# Patient Record
Sex: Male | Born: 1965 | Race: White | Hispanic: No | Marital: Married | Smoking: Never smoker
Health system: Southern US, Community
[De-identification: ages and names within clinical notes are randomized; demographics above are authoritative.]

## PROBLEM LIST (undated history)

## (undated) DIAGNOSIS — E291 Testicular hypofunction: Secondary | ICD-10-CM

## (undated) DIAGNOSIS — C801 Malignant (primary) neoplasm, unspecified: Secondary | ICD-10-CM

## (undated) DIAGNOSIS — Z8547 Personal history of malignant neoplasm of testis: Secondary | ICD-10-CM

## (undated) DIAGNOSIS — D582 Other hemoglobinopathies: Secondary | ICD-10-CM

## (undated) DIAGNOSIS — K3580 Unspecified acute appendicitis: Secondary | ICD-10-CM

## (undated) DIAGNOSIS — N400 Enlarged prostate without lower urinary tract symptoms: Secondary | ICD-10-CM

## (undated) DIAGNOSIS — E663 Overweight: Secondary | ICD-10-CM

## (undated) DIAGNOSIS — M109 Gout, unspecified: Secondary | ICD-10-CM

## (undated) DIAGNOSIS — K3532 Acute appendicitis with perforation and localized peritonitis, without abscess: Secondary | ICD-10-CM

## (undated) DIAGNOSIS — R739 Hyperglycemia, unspecified: Secondary | ICD-10-CM

## (undated) DIAGNOSIS — R7989 Other specified abnormal findings of blood chemistry: Secondary | ICD-10-CM

## (undated) DIAGNOSIS — R740 Nonspecific elevation of levels of transaminase and lactic acid dehydrogenase [LDH]: Secondary | ICD-10-CM

## (undated) DIAGNOSIS — E7800 Pure hypercholesterolemia, unspecified: Secondary | ICD-10-CM

## (undated) HISTORY — DX: Other specified abnormal findings of blood chemistry: R79.89

## (undated) HISTORY — DX: Pure hypercholesterolemia, unspecified: E78.00

## (undated) HISTORY — DX: Personal history of malignant neoplasm of testis: Z85.47

## (undated) HISTORY — DX: Overweight: E66.3

## (undated) HISTORY — DX: Gout, unspecified: M10.9

## (undated) HISTORY — DX: Nonspecific elevation of levels of transaminase and lactic acid dehydrogenase (ldh): R74.0

## (undated) HISTORY — DX: Unspecified acute appendicitis: K35.80

## (undated) HISTORY — PX: CHOLECYSTECTOMY: SHX55

## (undated) HISTORY — DX: Benign prostatic hyperplasia without lower urinary tract symptoms: N40.0

## (undated) HISTORY — DX: Acute appendicitis with perforation and localized peritonitis, without abscess: K35.32

## (undated) HISTORY — PX: GALLBLADDER SURGERY: SHX652

## (undated) HISTORY — DX: Testicular hypofunction: E29.1

## (undated) HISTORY — DX: Other hemoglobinopathies: D58.2

## (undated) HISTORY — DX: Hyperglycemia, unspecified: R73.9

## (undated) HISTORY — PX: OTHER SURGICAL HISTORY: SHX169

---

## 2004-12-15 ENCOUNTER — Ambulatory Visit: Payer: Self-pay | Admitting: Urology

## 2004-12-29 ENCOUNTER — Ambulatory Visit: Payer: Self-pay | Admitting: Radiation Oncology

## 2005-01-04 ENCOUNTER — Ambulatory Visit: Payer: Self-pay | Admitting: Radiation Oncology

## 2005-02-04 ENCOUNTER — Ambulatory Visit: Payer: Self-pay | Admitting: Radiation Oncology

## 2005-05-15 ENCOUNTER — Ambulatory Visit: Payer: Self-pay | Admitting: Oncology

## 2005-06-04 ENCOUNTER — Ambulatory Visit: Payer: Self-pay | Admitting: Oncology

## 2005-09-16 ENCOUNTER — Ambulatory Visit: Payer: Self-pay | Admitting: Family Medicine

## 2005-09-24 ENCOUNTER — Ambulatory Visit: Payer: Self-pay | Admitting: Family Medicine

## 2005-11-16 ENCOUNTER — Ambulatory Visit: Payer: Self-pay | Admitting: Oncology

## 2005-12-05 ENCOUNTER — Ambulatory Visit: Payer: Self-pay | Admitting: Oncology

## 2006-03-18 ENCOUNTER — Ambulatory Visit: Payer: Self-pay | Admitting: Oncology

## 2006-04-06 ENCOUNTER — Ambulatory Visit: Payer: Self-pay | Admitting: Oncology

## 2006-05-24 ENCOUNTER — Ambulatory Visit: Payer: Self-pay | Admitting: Oncology

## 2006-07-16 ENCOUNTER — Ambulatory Visit: Payer: Self-pay | Admitting: Oncology

## 2006-08-05 ENCOUNTER — Ambulatory Visit: Payer: Self-pay | Admitting: Oncology

## 2007-01-05 ENCOUNTER — Ambulatory Visit: Payer: Self-pay | Admitting: Oncology

## 2007-01-18 ENCOUNTER — Ambulatory Visit: Payer: Self-pay | Admitting: Oncology

## 2007-01-21 ENCOUNTER — Ambulatory Visit: Payer: Self-pay | Admitting: Oncology

## 2007-02-05 ENCOUNTER — Ambulatory Visit: Payer: Self-pay | Admitting: Oncology

## 2007-05-08 ENCOUNTER — Ambulatory Visit: Payer: Self-pay | Admitting: Oncology

## 2007-05-24 ENCOUNTER — Ambulatory Visit: Payer: Self-pay | Admitting: Oncology

## 2007-06-05 ENCOUNTER — Ambulatory Visit: Payer: Self-pay | Admitting: Oncology

## 2007-11-05 ENCOUNTER — Ambulatory Visit: Payer: Self-pay | Admitting: Oncology

## 2007-11-24 ENCOUNTER — Ambulatory Visit: Payer: Self-pay | Admitting: Oncology

## 2007-12-06 ENCOUNTER — Ambulatory Visit: Payer: Self-pay | Admitting: Oncology

## 2008-05-07 ENCOUNTER — Ambulatory Visit: Payer: Self-pay | Admitting: Oncology

## 2008-05-17 ENCOUNTER — Ambulatory Visit: Payer: Self-pay | Admitting: Oncology

## 2008-06-04 ENCOUNTER — Ambulatory Visit: Payer: Self-pay | Admitting: Oncology

## 2008-11-14 ENCOUNTER — Ambulatory Visit: Payer: Self-pay | Admitting: Oncology

## 2008-12-05 ENCOUNTER — Ambulatory Visit: Payer: Self-pay | Admitting: Oncology

## 2008-12-07 ENCOUNTER — Ambulatory Visit: Payer: Self-pay | Admitting: Oncology

## 2009-01-04 ENCOUNTER — Ambulatory Visit: Payer: Self-pay | Admitting: Oncology

## 2009-01-14 ENCOUNTER — Ambulatory Visit: Payer: Self-pay | Admitting: Gastroenterology

## 2009-03-06 ENCOUNTER — Ambulatory Visit: Payer: Self-pay | Admitting: Oncology

## 2009-03-21 ENCOUNTER — Ambulatory Visit: Payer: Self-pay | Admitting: Oncology

## 2009-04-06 ENCOUNTER — Ambulatory Visit: Payer: Self-pay | Admitting: Oncology

## 2009-06-04 ENCOUNTER — Ambulatory Visit: Payer: Self-pay | Admitting: Oncology

## 2009-06-07 ENCOUNTER — Ambulatory Visit: Payer: Self-pay | Admitting: Oncology

## 2009-07-05 ENCOUNTER — Ambulatory Visit: Payer: Self-pay | Admitting: Oncology

## 2010-01-04 ENCOUNTER — Ambulatory Visit: Payer: Self-pay | Admitting: Oncology

## 2010-01-16 ENCOUNTER — Ambulatory Visit: Payer: Self-pay | Admitting: Oncology

## 2010-01-18 LAB — BETA HCG QUANT (REF LAB)

## 2010-02-04 ENCOUNTER — Ambulatory Visit: Payer: Self-pay | Admitting: Oncology

## 2011-01-16 ENCOUNTER — Ambulatory Visit: Payer: Self-pay | Admitting: Oncology

## 2011-01-19 ENCOUNTER — Ambulatory Visit: Payer: Self-pay | Admitting: Oncology

## 2011-01-20 LAB — BETA HCG QUANT (REF LAB)

## 2011-01-20 LAB — AFP TUMOR MARKER: AFP-Tumor Marker: 2.5 ng/mL (ref 0.0–8.3)

## 2011-02-05 ENCOUNTER — Ambulatory Visit: Payer: Self-pay | Admitting: Oncology

## 2011-03-04 ENCOUNTER — Emergency Department: Payer: Self-pay | Admitting: Emergency Medicine

## 2012-01-19 ENCOUNTER — Ambulatory Visit: Payer: Self-pay | Admitting: Oncology

## 2012-01-26 ENCOUNTER — Ambulatory Visit: Payer: Self-pay | Admitting: Oncology

## 2012-01-26 LAB — COMPREHENSIVE METABOLIC PANEL
Albumin: 4.4 g/dL (ref 3.4–5.0)
Alkaline Phosphatase: 65 U/L (ref 50–136)
Anion Gap: 11 (ref 7–16)
BUN: 11 mg/dL (ref 7–18)
Bilirubin,Total: 1.1 mg/dL — ABNORMAL HIGH (ref 0.2–1.0)
Calcium, Total: 9.4 mg/dL (ref 8.5–10.1)
Chloride: 100 mmol/L (ref 98–107)
Creatinine: 1.11 mg/dL (ref 0.60–1.30)
Glucose: 93 mg/dL (ref 65–99)
Osmolality: 273 (ref 275–301)
Potassium: 4.1 mmol/L (ref 3.5–5.1)
SGOT(AST): 25 U/L (ref 15–37)
Sodium: 137 mmol/L (ref 136–145)
Total Protein: 7.8 g/dL (ref 6.4–8.2)

## 2012-01-26 LAB — CBC CANCER CENTER
Basophil #: 0 x10 3/mm (ref 0.0–0.1)
Eosinophil #: 0.1 x10 3/mm (ref 0.0–0.7)
Lymphocyte #: 1.5 x10 3/mm (ref 1.0–3.6)
MCH: 31.5 pg (ref 26.0–34.0)
Monocyte %: 9 %
Neutrophil %: 72.1 %
Platelet: 201 x10 3/mm (ref 150–440)
RBC: 5.38 10*6/uL (ref 4.40–5.90)
RDW: 14.2 % (ref 11.5–14.5)
WBC: 8.5 x10 3/mm (ref 3.8–10.6)

## 2012-01-27 LAB — BETA HCG QUANT (REF LAB)

## 2012-02-05 ENCOUNTER — Ambulatory Visit: Payer: Self-pay | Admitting: Oncology

## 2013-01-17 ENCOUNTER — Ambulatory Visit: Payer: Self-pay | Admitting: Oncology

## 2013-01-17 LAB — COMPREHENSIVE METABOLIC PANEL
Anion Gap: 12 (ref 7–16)
Bilirubin,Total: 1 mg/dL (ref 0.2–1.0)
Co2: 26 mmol/L (ref 21–32)
Creatinine: 1.14 mg/dL (ref 0.60–1.30)
EGFR (African American): >60
EGFR (Non-African Amer.): >60
Glucose: 111 mg/dL — ABNORMAL HIGH (ref 65–99)
Total Protein: 7.6 g/dL (ref 6.4–8.2)

## 2013-01-17 LAB — CBC CANCER CENTER
Basophil #: 0.1 x10 3/mm (ref 0.0–0.1)
Basophil %: 0.6 %
Eosinophil #: 0.2 x10 3/mm (ref 0.0–0.7)
Eosinophil %: 1.6 %
HGB: 18 g/dL (ref 13.0–18.0)
Lymphocyte #: 1.7 x10 3/mm (ref 1.0–3.6)
Lymphocyte %: 17.6 %
MCH: 32.5 pg (ref 26.0–34.0)
MCHC: 35 g/dL (ref 32.0–36.0)
MCV: 93 fL (ref 80–100)
Platelet: 230 x10 3/mm (ref 150–440)
RBC: 5.54 10*6/uL (ref 4.40–5.90)
RDW: 14 % (ref 11.5–14.5)

## 2013-01-18 LAB — AFP TUMOR MARKER: AFP-Tumor Marker: 2.3 ng/mL (ref 0.0–8.3)

## 2013-02-03 ENCOUNTER — Ambulatory Visit: Payer: Self-pay

## 2013-02-04 ENCOUNTER — Ambulatory Visit: Payer: Self-pay | Admitting: Oncology

## 2013-02-17 ENCOUNTER — Ambulatory Visit: Payer: Self-pay | Admitting: Vascular Surgery

## 2014-01-24 ENCOUNTER — Ambulatory Visit: Payer: Self-pay | Admitting: Oncology

## 2014-01-25 LAB — AFP TUMOR MARKER: AFP TUMOR MARKER: 2.3 ng/mL (ref 0.0–8.3)

## 2014-05-02 ENCOUNTER — Ambulatory Visit: Payer: Self-pay | Admitting: Oncology

## 2014-05-02 LAB — CBC CANCER CENTER
BASOS ABS: 0 x10 3/mm (ref 0.0–0.1)
Basophil %: 0.5 %
Eosinophil #: 0.2 x10 3/mm (ref 0.0–0.7)
Eosinophil %: 2.4 %
HCT: 50.1 % (ref 40.0–52.0)
HGB: 17.1 g/dL (ref 13.0–18.0)
Lymphocyte #: 1.4 x10 3/mm (ref 1.0–3.6)
Lymphocyte %: 16.3 %
MCH: 30.8 pg (ref 26.0–34.0)
MCHC: 34.1 g/dL (ref 32.0–36.0)
MCV: 90 fL (ref 80–100)
MONO ABS: 0.9 x10 3/mm (ref 0.2–1.0)
Monocyte %: 10.9 %
Neutrophil #: 5.8 x10 3/mm (ref 1.4–6.5)
Neutrophil %: 69.9 %
PLATELETS: 207 x10 3/mm (ref 150–440)
RBC: 5.56 10*6/uL (ref 4.40–5.90)
RDW: 13.1 % (ref 11.5–14.5)
WBC: 8.3 x10 3/mm (ref 3.8–10.6)

## 2014-05-07 ENCOUNTER — Ambulatory Visit: Payer: Self-pay | Admitting: Oncology

## 2014-07-20 ENCOUNTER — Other Ambulatory Visit: Payer: Self-pay | Admitting: Oncology

## 2014-07-20 DIAGNOSIS — Z8547 Personal history of malignant neoplasm of testis: Secondary | ICD-10-CM

## 2014-08-03 ENCOUNTER — Ambulatory Visit
Admit: 2014-08-03 | Disposition: A | Discharge status: Home / Self Care | Payer: Self-pay | Attending: Oncology | Admitting: Oncology

## 2014-08-03 LAB — CANCER CENTER HEMOGLOBIN: HGB: 17.3 g/dL (ref 13.0–18.0)

## 2014-08-03 LAB — CANCER CENTER HEMATOCRIT: HCT: 49.7 % (ref 40.0–52.0)

## 2014-10-12 ENCOUNTER — Other Ambulatory Visit: Payer: Self-pay

## 2014-10-12 DIAGNOSIS — E291 Testicular hypofunction: Secondary | ICD-10-CM

## 2014-10-13 LAB — TESTOSTERONE: Testosterone: 341 ng/dL — ABNORMAL LOW (ref 348–1197)

## 2014-10-13 LAB — PSA: Prostate Specific Ag, Serum: 1.1 ng/mL (ref 0.0–4.0)

## 2014-10-13 LAB — HEMATOCRIT: Hematocrit: 49.7 % (ref 37.5–51.0)

## 2014-10-17 ENCOUNTER — Ambulatory Visit: Payer: Self-pay | Admitting: Urology

## 2014-10-22 ENCOUNTER — Telehealth: Payer: Self-pay

## 2014-10-22 NOTE — Telephone Encounter (Signed)
Spoke with pt in reference to labs. Labs were drawn at 8:41 a.m. Pt was transferred to the front to make appt.

## 2014-10-22 NOTE — Telephone Encounter (Signed)
-----   Message from Nori Riis, PA-C sent at 10/21/2014  9:32 AM EDT ----- Patient needs an appointment.  Is the testosterone drawn in the am?

## 2014-10-23 ENCOUNTER — Other Ambulatory Visit: Payer: Self-pay

## 2014-10-26 ENCOUNTER — Other Ambulatory Visit: Payer: Self-pay | Admitting: *Deleted

## 2014-10-26 DIAGNOSIS — D751 Secondary polycythemia: Secondary | ICD-10-CM

## 2014-10-30 ENCOUNTER — Inpatient Hospital Stay: Payer: Self-pay

## 2014-10-30 ENCOUNTER — Inpatient Hospital Stay: Payer: Self-pay | Admitting: Oncology

## 2014-11-01 ENCOUNTER — Inpatient Hospital Stay: Payer: 59 | Attending: Oncology

## 2014-11-01 ENCOUNTER — Inpatient Hospital Stay (HOSPITAL_BASED_OUTPATIENT_CLINIC_OR_DEPARTMENT_OTHER): Payer: 59 | Admitting: Oncology

## 2014-11-01 ENCOUNTER — Inpatient Hospital Stay: Payer: 59

## 2014-11-01 ENCOUNTER — Encounter: Payer: Self-pay | Admitting: Oncology

## 2014-11-01 VITALS — BP 129/82 | HR 60 | Temp 95.9°F | Wt 231.3 lb

## 2014-11-01 DIAGNOSIS — Z79899 Other long term (current) drug therapy: Secondary | ICD-10-CM | POA: Insufficient documentation

## 2014-11-01 DIAGNOSIS — D751 Secondary polycythemia: Secondary | ICD-10-CM | POA: Insufficient documentation

## 2014-11-01 DIAGNOSIS — Z87891 Personal history of nicotine dependence: Secondary | ICD-10-CM

## 2014-11-01 DIAGNOSIS — C6290 Malignant neoplasm of unspecified testis, unspecified whether descended or undescended: Secondary | ICD-10-CM

## 2014-11-01 DIAGNOSIS — Z8547 Personal history of malignant neoplasm of testis: Secondary | ICD-10-CM | POA: Diagnosis not present

## 2014-11-01 LAB — CBC WITH DIFFERENTIAL/PLATELET
BASOS ABS: 0.1 10*3/uL (ref 0–0.1)
Basophils Relative: 1 %
EOS PCT: 2 %
Eosinophils Absolute: 0.1 10*3/uL (ref 0–0.7)
HCT: 49.1 % (ref 40.0–52.0)
Hemoglobin: 16.7 g/dL (ref 13.0–18.0)
Lymphocytes Relative: 24 %
Lymphs Abs: 1.7 10*3/uL (ref 1.0–3.6)
MCH: 30.8 pg (ref 26.0–34.0)
MCHC: 33.9 g/dL (ref 32.0–36.0)
MCV: 90.7 fL (ref 80.0–100.0)
Monocytes Absolute: 0.8 10*3/uL (ref 0.2–1.0)
Monocytes Relative: 11 %
NEUTROS ABS: 4.6 10*3/uL (ref 1.4–6.5)
NEUTROS PCT: 62 %
PLATELETS: 156 10*3/uL (ref 150–440)
RBC: 5.41 MIL/uL (ref 4.40–5.90)
RDW: 13.6 % (ref 11.5–14.5)
WBC: 7.3 10*3/uL (ref 3.8–10.6)

## 2014-11-01 NOTE — Progress Notes (Signed)
Patient does not have living will.  Declined information.  Former smoker. 

## 2014-11-04 NOTE — Progress Notes (Signed)
[place] @ [facility] Telephone:([phone]  Fax:(336) [place] OB: 15-Feb-1966  MR#: [id number]  HKV#:[id number]  Patient Care Team: Morton Peters., MD as PCP - General (Family Medicine)  CHIEF COMPLAINT:  Chief Complaint  Patient presents with  . Follow-up   Chief Complaint/Diagnosis:   Seminoma status post radicalOrchiectomy and prosthetic testes placementStatus post one cycle of carboplatinum Diagnoses in the September of 3259(49 years old) 2.  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  Shortness of breath no cough no headache     REVIEW OF SYSTEMS:   GENERAL:  Feels good.  Active.  No fevers, sweats or weight loss. PERFORMANCE STATUS (ECOG):  0 HEENT:  No visual changes, runny nose, sore throat, mouth sores or tenderness. Lungs: No shortness of breath or cough.  No hemoptysis. Cardiac:  No chest pain, palpitations, orthopnea, or PND. GI:  No nausea, vomiting, diarrhea, constipation, melena or hematochezia. GU:  No urgency, frequency, dysuria, or hematuria. Musculoskeletal:  No back pain.  No joint pain.  No muscle tenderness. Extremities:  No pain or swelling. Skin:  No rashes or skin changes. Neuro:  No headache, numbness or weakness, balance or coordination issues. Endocrine:  No diabetes, thyroid issues, hot flashes or night sweats. Psych:  No mood changes, depression or anxiety. Pain:  No focal pain. Review of systems:  All other systems reviewed and found to be negative. As per HPI. Otherwise, a complete review of systems is negatve.   Significant History/PMH:   Migraines:    orchiectomy:    Gall Bladder:   PFSH: Family History: noncontributory  Social History: noncontributory  Additional Past Medical and Surgical History:  Does not smoke does not drink   As mentioned above   ADVANCED DIRECTIVES:  Patient does not have any living will or healthcare power of attorney.  Information was given .  Available resources had been discussed.  We will follow-up on subsequent appointments regarding this issue  HEALTH MAINTENANCE: History  Substance Use Topics  . Smoking status: Former Research scientist (life sciences)  . Smokeless tobacco: Not on file  . Alcohol Use: Not on file      No Known Allergies  Current Outpatient Prescriptions  Medication Sig Dispense Refill  . allopurinol (ZYLOPRIM) 300 MG tablet TAKE 1 TABLET EVERY DAY AS DIRECTED  6   No current facility-administered medications for this visit.    OBJECTIVE:  Filed Vitals:   11/01/14 0857  BP: 129/82  Pulse: 60  Temp: 95.9 F (35.5 C)     There is no height on file to calculate BMI.    ECOG FS:0 - Asymptomatic  PHYSICAL EXAM: GENERAL:  Well developed, well nourished, sitting comfortably in the exam room in no acute distress. MENTAL STATUS:  Alert and oriented to person, place and time. . ENT:  Oropharynx clear without lesion.  Tongue normal. Mucous membranes moist.  RESPIRATORY:  Clear to auscultation without rales, wheezes or rhonchi. CARDIOVASCULAR:  Regular rate and rhythm without murmur, rub or gallop.  ABDOMEN:  Soft, non-tender, with active bowel sounds, and no hepatosplenomegaly.  No masses. BACK:  No CVA tenderness.  No tenderness on percussion of the back or rib cage. SKIN:  No rashes, ulcers or lesions. EXTREMITIES: No edema, no skin discoloration or tenderness.  No palpable cords. LYMPH NODES: No palpable cervical, supraclavicular, axillary  or inguinal adenopathy  NEUROLOGICAL: Unremarkable. PSYCH:  Appropriate.   LAB RESULTS:  Appointment on 11/01/2014  Component Date Value Ref Range Status  . WBC 11/01/2014 7.3  3.8 - 10.6 K/uL Final  . RBC 11/01/2014 5.41  4.40 - 5.90 MIL/uL Final  . Hemoglobin 11/01/2014 16.7  13.0 - 18.0 g/dL Final  . HCT  11/01/2014 49.1  40.0 - 52.0 % Final  . MCV 11/01/2014 90.7  80.0 - 100.0 fL Final  . MCH 11/01/2014 30.8  26.0 - 34.0 pg Final  . MCHC 11/01/2014 33.9  32.0 - 36.0 g/dL Final  . RDW 11/01/2014 13.6  11.5 - 14.5 % Final  . Platelets 11/01/2014 156  150 - 440 K/uL Final  . Neutrophils Relative % 11/01/2014 62   Final  . Neutro Abs 11/01/2014 4.6  1.4 - 6.5 K/uL Final  . Lymphocytes Relative 11/01/2014 24   Final  . Lymphs Abs 11/01/2014 1.7  1.0 - 3.6 K/uL Final  . Monocytes Relative 11/01/2014 11   Final  . Monocytes Absolute 11/01/2014 0.8  0.2 - 1.0 K/uL Final  . Eosinophils Relative 11/01/2014 2   Final  . Eosinophils Absolute 11/01/2014 0.1  0 - 0.7 K/uL Final  . Basophils Relative 11/01/2014 1   Final  . Basophils Absolute 11/01/2014 0.1  0 - 0.1 K/uL Final     ASSESSMENT: 1.  Secondary polycythemia.  Patient is asymptomatic.  We will hold off phlebotomy. Patient was advised to see if they can reduce the dose of testosterone. 2.  Seminoma No evidence of recurrent disease  MEDICAL DECISION MAKING:  All lab data has been reviewed  Patient expressed understanding and was in agreement with this plan. He also understands that He can call clinic at any time with any questions, concerns, or complaints.    No matching staging information was found for the patient.  Forest Gleason, MD   11/04/2014 4:00 PM

## 2014-11-05 ENCOUNTER — Encounter: Payer: Self-pay | Admitting: *Deleted

## 2014-11-06 ENCOUNTER — Encounter: Payer: Self-pay | Admitting: Urology

## 2014-11-06 ENCOUNTER — Ambulatory Visit (INDEPENDENT_AMBULATORY_CARE_PROVIDER_SITE_OTHER): Payer: 59 | Admitting: Urology

## 2014-11-06 VITALS — BP 114/77 | HR 60 | Ht 69.0 in | Wt 228.4 lb

## 2014-11-06 DIAGNOSIS — N400 Enlarged prostate without lower urinary tract symptoms: Secondary | ICD-10-CM | POA: Insufficient documentation

## 2014-11-06 DIAGNOSIS — E291 Testicular hypofunction: Secondary | ICD-10-CM | POA: Insufficient documentation

## 2014-11-06 DIAGNOSIS — N401 Enlarged prostate with lower urinary tract symptoms: Secondary | ICD-10-CM | POA: Diagnosis not present

## 2014-11-06 DIAGNOSIS — Z8547 Personal history of malignant neoplasm of testis: Secondary | ICD-10-CM | POA: Insufficient documentation

## 2014-11-06 DIAGNOSIS — N138 Other obstructive and reflux uropathy: Secondary | ICD-10-CM

## 2014-11-06 LAB — BLADDER SCAN AMB NON-IMAGING: Scan Result: 24

## 2014-11-06 NOTE — Progress Notes (Signed)
11/06/2014 9:32 AM   Tyrone Madden 04/22/1965 [id number]  Referring provider: No referring provider defined for this encounter.  Chief Complaint  Patient presents with  . Follow-up    Go over tests results  ( Lab results)    HPI: Tyrone Madden is a 49 year old white male with hypogonadism and BPH with LUTS who presents today for a follow-up visit.  His hypogonadism is currently being managed with Testopel insertions.  His last insertion was in February 2016. Today, he complains of lack of energy, a decrease in strength in his erections being less strong and a recent deterioration in his ability to play sports.  He was recently found to have have polycythemia.  It was recommended that his testosterone dose been reduced by his oncologist.       Androgen Deficiency in the Aging Male      11/06/14 0900       Androgen Deficiency in the Aging Male   Do you have a decrease in libido (sex drive) No     Do you have lack of energy Yes     Do you have a decrease in strength and/or endurance Yes     Have you lost height No     Have you noticed a decreased "enjoyment of life" No     Are you sad and/or grumpy No     Are your erections less strong Yes     Have you noticed a recent deterioration in your ability to play sports Yes     Are you falling asleep after dinner No     Has there been a recent deterioration in your work performance No       His IPSS score today is 5, which is mild lower urinary tract symptomatology. He is pleased with his quality life due to his urinary symptoms. His PVR is  24 mL.   He has no urinary complaints at this time.  He denies any dysuria, hematuria or suprapubic pain.   Previous PSA's:      1.0 ng/mL on 10/24/2012      0.9 ng/mL on 05/18/2013      1.2 ng/mL on 11/01/2013      1.1 ng/mL on 04/24/2014   He also denies any recent fevers, chills, nausea or vomiting.  He does not have a family history of PCa.      IPSS      11/06/14 0900       International Prostate Symptom Score   How often have you had the sensation of not emptying your bladder? Less than 1 in 5     How often have you had to urinate less than every two hours? Not at All     How often have you found you stopped and started again several times when you urinated? About half the time     How often have you found it difficult to postpone urination? Not at All     How often have you had a weak urinary stream? Not at All     How often have you had to strain to start urination? Not at All     How many times did you typically get up at night to urinate? 1 Time     Total IPSS Score 5     Quality of Life due to urinary symptoms   If you were to spend the rest of your life with your urinary condition just the way it is now how would  you feel about that? Pleased        Score:  1-7 Mild 8-19 Moderate 20-35 Severe     PMH: Past Medical History  Diagnosis Date  . Gout   . Hypogonadism in male   . Over weight   . BPH (benign prostatic hyperplasia)   . History of testicular cancer   . Elevated hemoglobin     Surgical History: Past Surgical History  Procedure Laterality Date  . Orchiectomy unilateral    . Gallbladder surgery      Home Medications:    Medication List       This list is accurate as of: 11/06/14  9:32 AM.  Always use your most recent med list.               allopurinol 300 MG tablet  Commonly known as:  ZYLOPRIM  TAKE 1 TABLET EVERY DAY AS DIRECTED        Allergies: No Known Allergies  Family History: Family History  Problem Relation Age of Onset  . Heart disease    . Prostate cancer Neg Hx   . Bladder Cancer Neg Hx     Social History:  reports that he has never smoked. He does not have any smokeless tobacco history on file. He reports that he drinks alcohol. He reports that he does not use illicit drugs.  ROS: UROLOGY Frequent Urination?: No Hard to postpone urination?: No Burning/pain with urination?: No Get up  at night to urinate?: No Leakage of urine?: No Urine stream starts and stops?: No Trouble starting stream?: No Do you have to strain to urinate?: No Blood in urine?: No Urinary tract infection?: No Sexually transmitted disease?: No Injury to kidneys or bladder?: No Painful intercourse?: No Weak stream?: No Erection problems?: No Penile pain?: No  Gastrointestinal Nausea?: No Vomiting?: No Indigestion/heartburn?: No Diarrhea?: No Constipation?: No  Constitutional Fever: No Night sweats?: No Weight loss?: No Fatigue?: No  Skin Skin rash/lesions?: No Itching?: No  Eyes Blurred vision?: No Double vision?: No  Ears/Nose/Throat Sore throat?: No Sinus problems?: No  Hematologic/Lymphatic Swollen glands?: No Easy bruising?: No  Cardiovascular Leg swelling?: No Chest pain?: No  Respiratory Cough?: No Shortness of breath?: No  Endocrine Excessive thirst?: No  Musculoskeletal Back pain?: No Joint pain?: No  Neurological Headaches?: No Dizziness?: No  Psychologic Depression?: No Anxiety?: No  Physical Exam: BP 114/77 mmHg  Pulse 60  Ht 5\' 9"  (1.753 m)  Wt 228 lb 6.4 oz (103.602 kg)  BMI 33.71 kg/m2  GU: Patient with circumcision phallus.  Urethral meatus is patent.  No penile discharge. No penile lesions or rashes. Scrotum without lesions, cysts, rashes and/or edema.  Right testicle is located scrotally.  Left testicular prothesis in place. No masses are appreciated in the testicles. Left and right epididymis are normal. Rectal: Patient with  normal sphincter tone. Perineum without scarring or rashes. No rectal masses are appreciated. Prostate is approximately 45 grams, no nodules are appreciated. Seminal vesicles are normal.  Laboratory Data: Results for orders placed or performed in visit on 11/06/14  BLADDER SCAN AMB NON-IMAGING  Result Value Ref Range   Scan Result 24    Lab Results  Component Value Date   WBC 7.3 11/01/2014   HGB 16.7  11/01/2014   HCT 49.1 11/01/2014   MCV 90.7 11/01/2014   PLT 156 11/01/2014    Lab Results  Component Value Date   CREATININE 1.14 01/17/2013    Lab Results  Component Value Date  PSA 1.1 10/12/2014    Lab Results  Component Value Date   TESTOSTERONE 341* 10/12/2014    No results found for: HGBA1C  Urinalysis No results found for: COLORURINE, APPEARANCEUR, LABSPEC, PHURINE, GLUCOSEU, HGBUR, BILIRUBINUR, KETONESUR, PROTEINUR, UROBILINOGEN, NITRITE, LEUKOCYTESUR  Pertinent Imaging:   Assessment & Plan:    1. Hypogonadism in male:   Patient's hypogonadism is managed with Testopel insetion.  He was found to be have polycythemia and it is recommended that he has his testosterone dose be reduced by his oncologist.    Recent testosterone is 341 ng/dL on 10/12/2014.   Patient will be scheduled for Testopel insertion with 6 pellets in September.   - Testosterone  2. BPH (benign prostatic hyperplasia) with LUTS:  Patient's IPSS score is 5/1.  His PVR 24 mL.  His DRE demonstrates slight enlargement.  We will continue observation.  He will follow up in 6 months for a PSA, DRE, PVR and an IPSS.    - PSA - BLADDER SCAN AMB NON-IMAGING  3. 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  Being follow by Dr. Jeb Levering.    No Follow-up on file.  Zara Council, [facility] [address], [place], [place] [address] [phone]

## 2014-11-07 LAB — TESTOSTERONE: TESTOSTERONE: 177 ng/dL — AB (ref 348–1197)

## 2014-11-07 LAB — PSA: Prostate Specific Ag, Serum: 0.8 ng/mL (ref 0.0–4.0)

## 2014-11-29 ENCOUNTER — Encounter: Payer: Self-pay | Admitting: Urology

## 2014-11-29 ENCOUNTER — Ambulatory Visit (INDEPENDENT_AMBULATORY_CARE_PROVIDER_SITE_OTHER): Payer: 59 | Admitting: Urology

## 2014-11-29 VITALS — BP 138/80 | HR 70 | Ht 68.0 in | Wt 233.8 lb

## 2014-11-29 DIAGNOSIS — E291 Testicular hypofunction: Secondary | ICD-10-CM | POA: Diagnosis not present

## 2014-11-29 MED ORDER — TESTOSTERONE 75 MG IL PLLT
75.0000 mg | PELLET | Freq: Once | Status: AC
  Administered 2014-11-29: 75 mg

## 2014-11-29 MED ORDER — LIDOCAINE-EPINEPHRINE 1 %-1:100000 IJ SOLN
10.0000 mL | Freq: Once | INTRAMUSCULAR | Status: AC
  Administered 2014-11-29: 10 mL via INTRADERMAL

## 2014-11-29 NOTE — Progress Notes (Signed)
This is a 49 -year-old male with hypogonadism and he is managed with Testopel. He presents today for Testopel insertion.  Patient is placed on the exam table in the left lateral jackknife position.  Identified upper outer quadrant of hip for insertion; prepped area with Betadine and injected 10 cc's of Lidocaine 1% with Epinephrine to anesthetize superficially and distally along trocar tract.  Made 3 mm incision using 15 blade of scalpel; trocar with sharp ended stylet was inserted into subcutaneous tissue in line with femur. Sharp stylet was withdrawn and 6 pellets were placed into trocar well. Testopel pellets advanced into tissue using blunt ended stylet. Trocar removed and incision closed using 6 Steri-Strips. Cleansed area to remove Betadine and covered Steri-Strips with outer Band-Aid.  Careful inspection of insertion is done and patient informed of post procedure instructions.  He will return in three month for serum testosterone before 9:00am.

## 2015-01-28 ENCOUNTER — Other Ambulatory Visit: Payer: Self-pay | Admitting: *Deleted

## 2015-01-28 ENCOUNTER — Inpatient Hospital Stay: Payer: 59 | Attending: Oncology

## 2015-01-28 DIAGNOSIS — Z8547 Personal history of malignant neoplasm of testis: Secondary | ICD-10-CM | POA: Diagnosis present

## 2015-01-28 DIAGNOSIS — C6290 Malignant neoplasm of unspecified testis, unspecified whether descended or undescended: Secondary | ICD-10-CM

## 2015-01-28 LAB — CBC WITH DIFFERENTIAL/PLATELET
Basophils Absolute: 0 10*3/uL (ref 0–0.1)
Basophils Relative: 1 %
EOS PCT: 2 %
Eosinophils Absolute: 0.1 10*3/uL (ref 0–0.7)
HCT: 49.3 % (ref 40.0–52.0)
Hemoglobin: 17 g/dL (ref 13.0–18.0)
LYMPHS PCT: 24 %
Lymphs Abs: 2.1 10*3/uL (ref 1.0–3.6)
MCH: 31.7 pg (ref 26.0–34.0)
MCHC: 34.5 g/dL (ref 32.0–36.0)
MCV: 91.9 fL (ref 80.0–100.0)
MONO ABS: 0.8 10*3/uL (ref 0.2–1.0)
MONOS PCT: 9 %
Neutro Abs: 5.6 10*3/uL (ref 1.4–6.5)
Neutrophils Relative %: 64 %
Platelets: 181 10*3/uL (ref 150–440)
RBC: 5.36 MIL/uL (ref 4.40–5.90)
RDW: 13.3 % (ref 11.5–14.5)
WBC: 8.7 10*3/uL (ref 3.8–10.6)

## 2015-01-29 LAB — BETA HCG QUANT (REF LAB): Beta hCG, Tumor Marker: <1 m[IU]/mL (ref 0–3)

## 2015-01-29 LAB — AFP TUMOR MARKER: AFP-Tumor Marker: 2.5 ng/mL (ref 0.0–8.3)

## 2015-02-01 ENCOUNTER — Other Ambulatory Visit: Payer: Self-pay | Admitting: Oncology

## 2015-02-01 DIAGNOSIS — Z8547 Personal history of malignant neoplasm of testis: Secondary | ICD-10-CM

## 2015-02-06 ENCOUNTER — Ambulatory Visit
Admission: RE | Admit: 2015-02-06 | Discharge: 2015-02-06 | Disposition: A | Discharge status: Home / Self Care | Payer: 59 | Source: Ambulatory Visit | Attending: Oncology | Admitting: Oncology

## 2015-02-06 DIAGNOSIS — Z8547 Personal history of malignant neoplasm of testis: Secondary | ICD-10-CM | POA: Diagnosis not present

## 2015-02-07 ENCOUNTER — Ambulatory Visit: Payer: 59

## 2015-02-07 ENCOUNTER — Ambulatory Visit: Payer: Self-pay | Admitting: Oncology

## 2015-02-07 ENCOUNTER — Other Ambulatory Visit: Payer: 59

## 2015-02-08 ENCOUNTER — Other Ambulatory Visit: Payer: Self-pay | Admitting: *Deleted

## 2015-02-08 DIAGNOSIS — Z8547 Personal history of malignant neoplasm of testis: Secondary | ICD-10-CM

## 2015-02-12 ENCOUNTER — Inpatient Hospital Stay: Payer: 59

## 2015-02-12 ENCOUNTER — Inpatient Hospital Stay: Payer: 59 | Attending: Oncology | Admitting: Oncology

## 2015-02-12 ENCOUNTER — Encounter: Payer: Self-pay | Admitting: Oncology

## 2015-02-12 VITALS — BP 119/87 | HR 69 | Temp 96.5°F | Wt 239.9 lb

## 2015-02-12 DIAGNOSIS — E895 Postprocedural testicular hypofunction: Secondary | ICD-10-CM | POA: Diagnosis not present

## 2015-02-12 DIAGNOSIS — Z8547 Personal history of malignant neoplasm of testis: Secondary | ICD-10-CM

## 2015-02-12 DIAGNOSIS — Z79899 Other long term (current) drug therapy: Secondary | ICD-10-CM | POA: Diagnosis not present

## 2015-02-12 DIAGNOSIS — N400 Enlarged prostate without lower urinary tract symptoms: Secondary | ICD-10-CM | POA: Diagnosis not present

## 2015-02-12 DIAGNOSIS — Z9221 Personal history of antineoplastic chemotherapy: Secondary | ICD-10-CM

## 2015-02-12 DIAGNOSIS — D751 Secondary polycythemia: Secondary | ICD-10-CM | POA: Diagnosis not present

## 2015-02-12 DIAGNOSIS — Z9079 Acquired absence of other genital organ(s): Secondary | ICD-10-CM

## 2015-02-12 DIAGNOSIS — M109 Gout, unspecified: Secondary | ICD-10-CM | POA: Insufficient documentation

## 2015-02-12 NOTE — Progress Notes (Signed)
Tyrone Madden  [phone]  Fax:(336) [phone]     [name] DOB: 22-Jul-1965  MR#: [id number]  MVE#:[id number]  Patient was evaluated on 02/12/2015.  Patient Care Team: Morton Peters., MD as PCP - General (Family Medicine)  CHIEF COMPLAINT:  Chief Complaint  Patient presents with  . OTHER   Patient with history of seminoma (2006), status post radical orchiectomy. Patient also with polycythemia secondary to testosterone use.  INTERVAL HISTORY: Patient is here for continued follow-up and treatment consideration regarding a history of a seminoma. Patient currently also followed up for polycythemia secondary to testosterone use. Patient is status post radical orchiectomy of the left testes with prosthetic testes placement. According to notes he also received one cycle of carboplatinum in 2006. Patient reports overall feeling very well. He reports that recently his urologist has decreased his testosterone dose. He is currently receiving Testopel, 6 pellets injected into the buttocks every 3 months. Patient also reports routine self-examinations.   REVIEW OF SYSTEMS:   Review of Systems  Constitutional: Negative for fever, chills, weight loss, malaise/fatigue and diaphoresis.  HENT: Negative for congestion, ear discharge, ear pain, hearing loss, nosebleeds, sore throat and tinnitus.   Eyes: Negative for blurred vision, double vision, photophobia, pain, discharge and redness.  Respiratory: Negative for cough, hemoptysis, sputum production, shortness of breath, wheezing and stridor.   Cardiovascular: Negative for chest pain, palpitations, orthopnea, claudication, leg swelling and PND.  Gastrointestinal: Negative for heartburn, nausea, vomiting, abdominal pain, diarrhea, constipation, blood in stool and melena.  Genitourinary: Negative.   Musculoskeletal: Negative.   Skin: Negative.   Neurological: Negative for dizziness, tingling, focal weakness,  seizures, weakness and headaches.  Endo/Heme/Allergies: Does not bruise/bleed easily.  Psychiatric/Behavioral: Negative for depression. The patient is not nervous/anxious and does not have insomnia.     As per HPI. Otherwise, a complete review of systems is negatve.   PAST MEDICAL HISTORY: Past Medical History  Diagnosis Date  . Gout   . Hypogonadism in male   . Over weight   . BPH (benign prostatic hyperplasia)   . History of testicular cancer   . Elevated hemoglobin     PAST SURGICAL HISTORY: Past Surgical History  Procedure Laterality Date  . Orchiectomy unilateral    . Gallbladder surgery      FAMILY HISTORY Family History  Problem Relation Age of Onset  . Heart disease    . Prostate cancer Neg Hx   . Bladder Cancer Neg Hx   . Kidney disease Neg Hx     GYNECOLOGIC HISTORY:  No LMP for male patient.     ADVANCED DIRECTIVES:    HEALTH MAINTENANCE: Social History  Substance Use Topics  . Smoking status: Never Smoker   . Smokeless tobacco: Not on file  . Alcohol Use: 0.0 oz/week    0 Standard drinks or equivalent per week     Comment: occasional     Colonoscopy:  PAP:  Bone density:  Lipid panel:  No Known Allergies  Current Outpatient Prescriptions  Medication Sig Dispense Refill  . allopurinol (ZYLOPRIM) 300 MG tablet TAKE 1 TABLET EVERY DAY AS DIRECTED  6   No current facility-administered medications for this visit.    OBJECTIVE: BP 119/87 mmHg  Pulse 69  Temp(Src) 96.5 F (35.8 C) (Tympanic)  Wt 239 lb 13.8 oz (108.8 kg)   Body mass index is 36.48 kg/(m^2).    ECOG FS:0 - Asymptomatic  General: Well-developed, well-nourished, no acute distress. Eyes: Pink conjunctiva, anicteric  sclera. HEENT: Normocephalic, moist mucous membranes, clear oropharnyx. Lungs: Clear to auscultation bilaterally. Heart: Regular rate and rhythm. No rubs, murmurs, or gallops. Abdomen: Soft, nontender, nondistended. No organomegaly noted, normoactive bowel  sounds. Musculoskeletal: No edema, cyanosis, or clubbing. Neuro: Alert, answering all questions appropriately. Cranial nerves grossly intact. Skin: No rashes or petechiae noted. Psych: Normal affect. Lymphatics: No cervical or clavicular LAD.   LAB RESULTS:  No visits with results within 3 Day(s) from this visit. Latest known visit with results is:  Appointment on 01/28/2015  Component Date Value Ref Range Status  . WBC 01/28/2015 8.7  3.8 - 10.6 K/uL Final  . RBC 01/28/2015 5.36  4.40 - 5.90 MIL/uL Final  . Hemoglobin 01/28/2015 17.0  13.0 - 18.0 g/dL Final  . HCT 01/28/2015 49.3  40.0 - 52.0 % Final  . MCV 01/28/2015 91.9  80.0 - 100.0 fL Final  . MCH 01/28/2015 31.7  26.0 - 34.0 pg Final  . MCHC 01/28/2015 34.5  32.0 - 36.0 g/dL Final  . RDW 01/28/2015 13.3  11.5 - 14.5 % Final  . Platelets 01/28/2015 181  150 - 440 K/uL Final  . Neutrophils Relative % 01/28/2015 64   Final  . Neutro Abs 01/28/2015 5.6  1.4 - 6.5 K/uL Final  . Lymphocytes Relative 01/28/2015 24   Final  . Lymphs Abs 01/28/2015 2.1  1.0 - 3.6 K/uL Final  . Monocytes Relative 01/28/2015 9   Final  . Monocytes Absolute 01/28/2015 0.8  0.2 - 1.0 K/uL Final  . Eosinophils Relative 01/28/2015 2   Final  . Eosinophils Absolute 01/28/2015 0.1  0 - 0.7 K/uL Final  . Basophils Relative 01/28/2015 1   Final  . Basophils Absolute 01/28/2015 0.0  0 - 0.1 K/uL Final  . AFP-Tumor Marker 01/28/2015 2.5  0.0 - 8.3 ng/mL Final   Comment: (NOTE) Roche ECLIA methodology Performed At: [facility] [place], [place] [id number] Lindon Romp MD OE:[id number]   . Beta hCG, Tumor Marker 01/28/2015 <1  0 - 3 mIU/mL Final   Comment: (NOTE) Roche ECLIA methodology Performed At: [facility] [place], [place] [id number] Lindon Romp MD VQ:[id number]     STUDIES: No results found.  ASSESSMENT:  Seminoma. Secondary polycythemia.   PLAN:  1. Seminoma. Patient is status  post left colectomy with testicular prosthesis placed. Patient has had an ultrasound on 02/06/2015 that revealed no evidence of right testicular mass, torsion, or acute epididymitis. Also no abnormal findings in the left orchiectomy bed. Clinically there is no evidence of recurrent disease. AFP and beta hCG tumor markers are within normal limits.   2. Secondary polycythemia. 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 Patient is currently asymptomatic. Patient is currently receiving Testopel 6 pellets inserted in the buttocks every 3 months urologists office. This has decreased from 15 tablets every 6 months. Hematocrit remains stable, does not require phlebotomy at this time. We will continue to monitor.  Patient expressed understanding and was in agreement with this plan. He also understands that He can call clinic at any time with any questions, concerns, or complaints.   Dr. Oliva Bustard was available for consultation and review of plan of care for this patient.  Evlyn Kanner, NP   02/12/2015 11:46 AM

## 2015-02-12 NOTE — Progress Notes (Deleted)
[place] @ [facility] Telephone:(336) [phone]  Fax:([phone]) [place] OB: November 28, 1965  MR#: [id number]  ZHY#:[id number]  Patient Care Team: Morton Peters., MD as PCP - General (Family Medicine)  CHIEF COMPLAINT:  Chief Complaint  Patient presents with  . OTHER   Chief Complaint/Diagnosis:   Seminoma status post radicalOrchiectomy and prosthetic testes placementStatus post one cycle of carboplatinum Diagnoses in the September of 7557(49 years old) 2.  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  Shortness of breath no cough no headache     REVIEW OF SYSTEMS:   GENERAL:  Feels good.  Active.  No fevers, sweats or weight loss. PERFORMANCE STATUS (ECOG):  0 HEENT:  No visual changes, runny nose, sore throat, mouth sores or tenderness. Lungs: No shortness of breath or cough.  No hemoptysis. Cardiac:  No chest pain, palpitations, orthopnea, or PND. GI:  No nausea, vomiting, diarrhea, constipation, melena or hematochezia. GU:  No urgency, frequency, dysuria, or hematuria. Musculoskeletal:  No back pain.  No joint pain.  No muscle tenderness. Extremities:  No pain or swelling. Skin:  No rashes or skin changes. Neuro:  No headache, numbness or weakness, balance or coordination issues. Endocrine:  No diabetes, thyroid issues, hot flashes or night sweats. Psych:  No mood changes, depression or anxiety. Pain:  No focal pain. Review of systems:  All other systems reviewed and found to be negative. As per HPI. Otherwise, a complete review of systems is negatve.   Significant History/PMH:   Migraines:    orchiectomy:    Gall Bladder:   PFSH: Family History: noncontributory  Social History: noncontributory  Additional Past Medical and Surgical History: Does  not smoke does not drink   As mentioned above   ADVANCED DIRECTIVES:  Patient does not have any living will or healthcare power of attorney.  Information was given .  Available resources had been discussed.  We will follow-up on subsequent appointments regarding this issue  HEALTH MAINTENANCE: Social History  Substance Use Topics  . Smoking status: Never Smoker   . Smokeless tobacco: None  . Alcohol Use: 0.0 oz/week    0 Standard drinks or equivalent per week     Comment: occasional      No Known Allergies  Current Outpatient Prescriptions  Medication Sig Dispense Refill  . allopurinol (ZYLOPRIM) 300 MG tablet TAKE 1 TABLET EVERY DAY AS DIRECTED  6   No current facility-administered medications for this visit.    OBJECTIVE:  Filed Vitals:   02/12/15 1117  BP: 119/87  Pulse: 69  Temp: 96.5 F (35.8 C)     Body mass index is 36.48 kg/(m^2).    ECOG FS:0 - Asymptomatic  PHYSICAL EXAM: GENERAL:  Well developed, well nourished, sitting comfortably in the exam room in no acute distress. MENTAL STATUS:  Alert and oriented to person, place and time. . ENT:  Oropharynx clear without lesion.  Tongue normal. Mucous membranes moist.  RESPIRATORY:  Clear to auscultation without rales, wheezes or rhonchi. CARDIOVASCULAR:  Regular rate and rhythm without murmur, rub or gallop.  ABDOMEN:  Soft, non-tender, with active bowel sounds, and no hepatosplenomegaly.  No masses. BACK:  No CVA tenderness.  No tenderness on percussion of the back or rib cage. SKIN:  No rashes, ulcers or lesions. EXTREMITIES: No edema, no skin discoloration or  tenderness.  No palpable cords. LYMPH NODES: No palpable cervical, supraclavicular, axillary or inguinal adenopathy  NEUROLOGICAL: Unremarkable. PSYCH:  Appropriate.   LAB RESULTS:  No visits with results within 2 Day(s) from this visit. Latest known visit with results is:  Appointment on 01/28/2015  Component Date Value Ref Range Status  . WBC  01/28/2015 8.7  3.8 - 10.6 K/uL Final  . RBC 01/28/2015 5.36  4.40 - 5.90 MIL/uL Final  . Hemoglobin 01/28/2015 17.0  13.0 - 18.0 g/dL Final  . HCT 01/28/2015 49.3  40.0 - 52.0 % Final  . MCV 01/28/2015 91.9  80.0 - 100.0 fL Final  . MCH 01/28/2015 31.7  26.0 - 34.0 pg Final  . MCHC 01/28/2015 34.5  32.0 - 36.0 g/dL Final  . RDW 01/28/2015 13.3  11.5 - 14.5 % Final  . Platelets 01/28/2015 181  150 - 440 K/uL Final  . Neutrophils Relative % 01/28/2015 64   Final  . Neutro Abs 01/28/2015 5.6  1.4 - 6.5 K/uL Final  . Lymphocytes Relative 01/28/2015 24   Final  . Lymphs Abs 01/28/2015 2.1  1.0 - 3.6 K/uL Final  . Monocytes Relative 01/28/2015 9   Final  . Monocytes Absolute 01/28/2015 0.8  0.2 - 1.0 K/uL Final  . Eosinophils Relative 01/28/2015 2   Final  . Eosinophils Absolute 01/28/2015 0.1  0 - 0.7 K/uL Final  . Basophils Relative 01/28/2015 1   Final  . Basophils Absolute 01/28/2015 0.0  0 - 0.1 K/uL Final  . AFP-Tumor Marker 01/28/2015 2.5  0.0 - 8.3 ng/mL Final   Comment: (NOTE) Roche ECLIA methodology Performed At: [facility] [place], [place] [id number] Lindon Romp MD NW:[id number]   . Beta hCG, Tumor Marker 01/28/2015 <1  0 - 3 mIU/mL Final   Comment: (NOTE) Roche ECLIA methodology Performed At: [facility] [place], [place] [id number] Lindon Romp MD BM:[id number]      ASSESSMENT: 1.  Secondary polycythemia.  Patient is asymptomatic.  We will hold off phlebotomy. Patient was advised to see if they can reduce the dose of testosterone. 2.  Seminoma No evidence of recurrent disease  MEDICAL DECISION MAKING:  All lab data has been reviewed  Patient expressed understanding and was in agreement with this plan. He also understands that He can call clinic at any time with any questions, concerns, or complaints.    No matching staging information was found for the patient.  Forest Gleason, MD   02/12/2015 11:48  AM

## 2015-02-13 ENCOUNTER — Other Ambulatory Visit: Payer: Self-pay | Admitting: Family Medicine

## 2015-03-07 ENCOUNTER — Other Ambulatory Visit: Payer: 59

## 2015-03-07 DIAGNOSIS — E291 Testicular hypofunction: Secondary | ICD-10-CM

## 2015-03-07 NOTE — Addendum Note (Signed)
Addended by: Lestine Box on: 03/07/2015 08:18 AM   Modules accepted: Orders

## 2015-03-08 LAB — HEPATIC FUNCTION PANEL
ALK PHOS: 58 IU/L (ref 39–117)
ALT: 72 IU/L — AB (ref 0–44)
AST: 39 IU/L (ref 0–40)
Albumin: 4.8 g/dL (ref 3.5–5.5)
Bilirubin Total: 1.2 mg/dL (ref 0.0–1.2)
Bilirubin, Direct: 0.26 mg/dL (ref 0.00–0.40)
Total Protein: 7.2 g/dL (ref 6.0–8.5)

## 2015-03-08 LAB — LIPID PANEL
CHOLESTEROL TOTAL: 214 mg/dL — AB (ref 100–199)
Chol/HDL Ratio: 3.9 ratio units (ref 0.0–5.0)
HDL: 55 mg/dL (ref >39)
LDL CALC: 136 mg/dL — AB (ref 0–99)
Triglycerides: 117 mg/dL (ref 0–149)
VLDL CHOLESTEROL CAL: 23 mg/dL (ref 5–40)

## 2015-03-08 LAB — TESTOSTERONE: TESTOSTERONE: 265 ng/dL — AB (ref 348–1197)

## 2015-03-08 LAB — HEMATOCRIT: Hematocrit: 49.4 % (ref 37.5–51.0)

## 2015-03-08 LAB — TSH: TSH: 2 u[IU]/mL (ref 0.450–4.500)

## 2015-03-08 LAB — PSA: PROSTATE SPECIFIC AG, SERUM: 1 ng/mL (ref 0.0–4.0)

## 2015-03-11 ENCOUNTER — Telehealth: Payer: Self-pay

## 2015-03-11 NOTE — Telephone Encounter (Signed)
-----   Message from Nori Riis, PA-C sent at 03/10/2015  7:01 PM EST ----- One of the patient's liver enzymes have increased significantly over the last 2 years.  He will need to contact his primary care physician to see if it safe to continue testosterone therapy.

## 2015-03-11 NOTE — Telephone Encounter (Signed)
Spoke with pt in reference to liver enzymes. Pt stated he would call his PCP today.

## 2015-03-25 ENCOUNTER — Ambulatory Visit: Payer: 59 | Admitting: Urology

## 2015-03-26 ENCOUNTER — Ambulatory Visit (INDEPENDENT_AMBULATORY_CARE_PROVIDER_SITE_OTHER): Payer: 59 | Admitting: Urology

## 2015-03-26 ENCOUNTER — Encounter: Payer: Self-pay | Admitting: Urology

## 2015-03-26 VITALS — BP 122/70 | HR 65 | Ht 68.0 in | Wt 241.8 lb

## 2015-03-26 DIAGNOSIS — R740 Nonspecific elevation of levels of transaminase and lactic acid dehydrogenase [LDH]: Secondary | ICD-10-CM | POA: Diagnosis not present

## 2015-03-26 DIAGNOSIS — E291 Testicular hypofunction: Secondary | ICD-10-CM | POA: Diagnosis not present

## 2015-03-26 DIAGNOSIS — R7401 Elevation of levels of liver transaminase levels: Secondary | ICD-10-CM

## 2015-03-26 HISTORY — DX: Elevation of levels of liver transaminase levels: R74.01

## 2015-03-26 NOTE — Progress Notes (Signed)
03/26/2015 10:04 PM   Tyrone Madden June 25, 1965 [id number]  Referring provider: Morton Peters., MD [address] [place], [place] [address]  Chief Complaint  Patient presents with  . Hypogonadism    follow up should have Testopel if has pcp letter     HPI: Patient is a 49 year old Caucasian male with a history of testicular cancer and hypogonadism who is managing it with Testopel insertion who was found to have elevated LFTs and was instructed to obtain an evaluation with his primary care physician. If his primary care physician felt it was safe to continue with testosterone therapy, I would continue the insertion of the Testopel pellets. Unfortunately, the patient did not have an evaluation by his primary care physician prior to this appointment.    PMH: Past Medical History  Diagnosis Date  . Gout   . Hypogonadism in male   . Over weight   . BPH (benign prostatic hyperplasia)   . History of testicular cancer   . Elevated hemoglobin ([place])     Surgical History: Past Surgical History  Procedure Laterality Date  . Orchiectomy unilateral    . Gallbladder surgery      Home Medications:    Medication List       This list is accurate as of: 03/26/15 10:04 PM.  Always use your most recent med list.               allopurinol 300 MG tablet  Commonly known as:  ZYLOPRIM  TAKE 1 TABLET EVERY DAY AS DIRECTED        Allergies: No Known Allergies  Family History: Family History  Problem Relation Age of Onset  . Heart disease    . Prostate cancer Neg Hx   . Bladder Cancer Neg Hx   . Kidney disease Neg Hx     Social History:  reports that he has never smoked. He does not have any smokeless tobacco history on file. He reports that he drinks alcohol. He reports that he does not use illicit drugs.  ROS: UROLOGY Frequent Urination?: No Hard to postpone urination?: No Burning/pain with urination?: No Get up at night to urinate?: No Leakage of urine?:  No Urine stream starts and stops?: No Trouble starting stream?: No Do you have to strain to urinate?: No Blood in urine?: No Urinary tract infection?: No Sexually transmitted disease?: No Injury to kidneys or bladder?: No Painful intercourse?: No Weak stream?: No Erection problems?: No Penile pain?: No  Gastrointestinal Nausea?: No Vomiting?: No Indigestion/heartburn?: No Diarrhea?: No Constipation?: No  Constitutional Fever: No Night sweats?: No Weight loss?: No Fatigue?: No  Skin Skin rash/lesions?: No Itching?: No  Eyes Blurred vision?: No Double vision?: No  Ears/Nose/Throat Sore throat?: No Sinus problems?: No  Hematologic/Lymphatic Swollen glands?: No Easy bruising?: No  Cardiovascular Leg swelling?: No Chest pain?: No  Respiratory Cough?: No Shortness of breath?: No  Endocrine Excessive thirst?: No  Musculoskeletal Back pain?: No Joint pain?: No  Neurological Headaches?: No Dizziness?: No  Psychologic Depression?: No Anxiety?: No  Physical Exam: BP 122/70 mmHg  Pulse 65  Ht 5\' 8"  (1.727 m)  Wt 241 lb 12.8 oz (109.68 kg)  BMI 36.77 kg/m2  Constitutional: Well nourished. Alert and oriented, No acute distress. HEENT: [place] AT, moist mucus membranes. Trachea midline, no masses. Cardiovascular: No clubbing, cyanosis, or edema. Respiratory: Normal respiratory effort, no increased work of breathing. Skin: No rashes, bruises or suspicious lesions. Lymph: No cervical or inguinal adenopathy. Neurologic:  Grossly intact, no focal deficits, moving all 4 extremities. Psychiatric: Normal mood and affect.  Laboratory Data: Lab Results  Component Value Date   WBC 8.7 01/28/2015   HGB 17.0 01/28/2015   HCT 49.4 03/07/2015   MCV 91.9 01/28/2015   PLT 181 01/28/2015    Lab Results  Component Value Date   CREATININE 1.14 01/17/2013    Lab Results  Component Value Date   PSA 1.0 03/07/2015   PSA 0.8 11/06/2014   PSA 1.1 10/12/2014     Lab Results  Component Value Date   TESTOSTERONE 265* 03/07/2015     Results for orders placed or performed in visit on 03/07/15  PSA  Result Value Ref Range   Prostate Specific Ag, Serum 1.0 0.0 - 4.0 ng/mL  Lipid panel  Result Value Ref Range   Cholesterol, Total 214 (H) 100 - 199 mg/dL   Triglycerides 117 0 - 149 mg/dL   HDL 55 >39 mg/dL   VLDL Cholesterol Cal 23 5 - 40 mg/dL   LDL Calculated 136 (H) 0 - 99 mg/dL   Chol/HDL Ratio 3.9 0.0 - 5.0 ratio units  Hepatic function panel  Result Value Ref Range   Total Protein 7.2 6.0 - 8.5 g/dL   Albumin 4.8 3.5 - 5.5 g/dL   Bilirubin Total 1.2 0.0 - 1.2 mg/dL   Bilirubin, Direct 0.26 0.00 - 0.40 mg/dL   Alkaline Phosphatase 58 39 - 117 IU/L   AST 39 0 - 40 IU/L   ALT 72 (H) 0 - 44 IU/L  Testosterone  Result Value Ref Range   Testosterone 265 (L) 348 - 1197 ng/dL   Comment, Testosterone Comment   TSH  Result Value Ref Range   TSH 2.000 0.450 - 4.500 uIU/mL  Hematocrit  Result Value Ref Range   Hematocrit 49.4 37.5 - 51.0 %      Assessment & Plan:    1. Elevated liver enzymes:   Patient will contact his primary care physician for further evaluation of his elevated liver enzymes. He will provide a letter stating it is safe to proceed with testosterone therapy from his primary care physician if its appropriate.  2. Hypogonadism:   Patient's testosterone therapy will be held at this time until further workup of his elevated LFTs.    Return for pending evaluation for elevated LFT's.  Zara Council, [facility] [address] [place], [place] [address] [phone]

## 2015-03-29 ENCOUNTER — Telehealth: Payer: Self-pay | Admitting: *Deleted

## 2015-03-29 NOTE — Telephone Encounter (Signed)
LMOM, but patient called right back. Spoke to patient to let him know I have received letter from PCP and I have scheduled him an appt for Jan 6 at 9 and he needs to come and fill out a new Testopel form for the new year. Patient ok with plan and understands his appointment may be moved if authorization is not approved in time.

## 2015-04-12 ENCOUNTER — Encounter: Payer: Self-pay | Admitting: Urology

## 2015-04-12 ENCOUNTER — Ambulatory Visit (INDEPENDENT_AMBULATORY_CARE_PROVIDER_SITE_OTHER): Payer: 59 | Admitting: Urology

## 2015-04-12 VITALS — BP 122/87 | HR 69 | Ht 68.5 in | Wt 239.7 lb

## 2015-04-12 DIAGNOSIS — E291 Testicular hypofunction: Secondary | ICD-10-CM | POA: Diagnosis not present

## 2015-04-12 MED ORDER — TESTOSTERONE 75 MG IL PLLT: 75.0000 mg | PELLET | Freq: Once | Status: AC

## 2015-04-12 MED ORDER — TESTOSTERONE 75 MG IL PLLT
75.0000 mg | PELLET | Freq: Once | Status: AC
  Administered 2015-04-12: 75 mg

## 2015-04-12 NOTE — Progress Notes (Signed)
This is a 50 -year-old male with hypogonadism and he is managed with Testopel. He presents today for Testopel insertion.  Patient is placed on the exam table in the right lateral jackknife position.  Identified upper outer quadrant of hip for insertion; prepped area with Betadine and injected 10 cc's of Lidocaine 1% with Epinephrine to anesthetize superficially and distally along trocar tract.  Made 3 mm incision using 15 blade of scalpel; trocar with sharp ended stylet was inserted into subcutaneous tissue in line with femur. Sharp stylet was withdrawn and 6 pellets were placed into trocar well. Testopel pellets advanced into tissue using blunt ended stylet. Trocar removed and incision closed using 6 Steri-Strips. Cleansed area to remove Betadine and covered Steri-Strips with outer Band-Aid.  Careful inspection of insertion is done and patient informed of post procedure instructions.  He will return in three month for serum testosterone and HCT.

## 2015-05-30 DIAGNOSIS — M109 Gout, unspecified: Secondary | ICD-10-CM | POA: Insufficient documentation

## 2015-05-30 DIAGNOSIS — R7989 Other specified abnormal findings of blood chemistry: Secondary | ICD-10-CM | POA: Insufficient documentation

## 2015-05-30 DIAGNOSIS — E7800 Pure hypercholesterolemia, unspecified: Secondary | ICD-10-CM | POA: Insufficient documentation

## 2015-05-30 DIAGNOSIS — R739 Hyperglycemia, unspecified: Secondary | ICD-10-CM

## 2015-05-30 HISTORY — DX: Hyperglycemia, unspecified: R73.9

## 2015-05-30 HISTORY — DX: Pure hypercholesterolemia, unspecified: E78.00

## 2015-05-30 HISTORY — DX: Other specified abnormal findings of blood chemistry: R79.89

## 2015-07-10 ENCOUNTER — Other Ambulatory Visit: Payer: Self-pay

## 2015-07-10 DIAGNOSIS — E291 Testicular hypofunction: Secondary | ICD-10-CM

## 2015-07-11 ENCOUNTER — Other Ambulatory Visit: Payer: 59

## 2015-07-11 DIAGNOSIS — E291 Testicular hypofunction: Secondary | ICD-10-CM

## 2015-07-12 ENCOUNTER — Telehealth: Payer: Self-pay

## 2015-07-12 LAB — TESTOSTERONE: Testosterone: 209 ng/dL — ABNORMAL LOW (ref 348–1197)

## 2015-07-12 LAB — HEMATOCRIT: HEMATOCRIT: 49.5 % (ref 37.5–51.0)

## 2015-07-12 NOTE — Telephone Encounter (Signed)
-----   Message from Nori Riis, PA-C sent at 07/12/2015  8:14 AM EDT ----- Would you add an estradiol level to his blood work?  His testosterone is low.  I will be out of the office next week, so I will get back with him when I return.

## 2015-07-12 NOTE — Telephone Encounter (Signed)
Labs were added.

## 2015-07-16 LAB — ESTRADIOL: Estradiol: 32.4 pg/mL (ref 7.6–42.6)

## 2015-07-16 LAB — SPECIMEN STATUS REPORT

## 2015-07-22 ENCOUNTER — Telehealth: Payer: Self-pay

## 2015-07-22 NOTE — Telephone Encounter (Signed)
His labs are as expected.  He may have a Testopel.

## 2015-07-22 NOTE — Telephone Encounter (Signed)
Pt called wanting lab results and wanted to know if he is going to be able to go back on testosterone therapy. Please advise.

## 2015-07-22 NOTE — Telephone Encounter (Signed)
LMOM

## 2015-07-23 NOTE — Telephone Encounter (Signed)
Advised pt his labs are as expected & he may have a Testopel. Pt states he will call back to scheduled appt. He has a busy schedule right now.

## 2015-07-23 NOTE — Telephone Encounter (Signed)
LMOM

## 2015-08-08 ENCOUNTER — Ambulatory Visit (INDEPENDENT_AMBULATORY_CARE_PROVIDER_SITE_OTHER): Payer: 59 | Admitting: Urology

## 2015-08-08 VITALS — BP 128/77 | HR 80 | Ht 68.0 in | Wt 236.0 lb

## 2015-08-08 DIAGNOSIS — E291 Testicular hypofunction: Secondary | ICD-10-CM

## 2015-08-08 MED ORDER — TADALAFIL 20 MG PO TABS: 20.0000 mg | ORAL_TABLET | Freq: Every day | ORAL | Status: DC | PRN

## 2015-08-08 MED ORDER — TESTOSTERONE 75 MG IL PLLT: 75.0000 mg | PELLET | Freq: Once | Status: AC

## 2015-08-08 NOTE — Progress Notes (Signed)
This is a 50 -year-old male with hypogonadism and he is managed with Testopel. He presents today for Testopel insertion.  Patient is placed on the exam table in the leftlateral jackknife position.  Identified upper outer quadrant of hip for insertion; prepped area with Betadine and injected 10 cc's of Lidocaine 1% with Epinephrine to anesthetize superficially and distally along trocar tract.  Made 3 mm incision using 10 blade of scalpel; trocar with sharp ended stylet was inserted into subcutaneous tissue in line with femur. Sharp stylet was withdrawn and 6 pellets were placed into trocar well. Testopel pellets advanced into tissue using blunt ended stylet. Trocar removed and incision closed using 6 Steri-Strips. Cleansed area to remove Betadine and covered Steri-Strips with outer Band-Aid.  Careful inspection of insertion is done and patient informed of post procedure instructions.  He will return in three month for serum testosterone, HCT and PSA and office visit with ADAM, IPSS and SHIM

## 2015-08-13 ENCOUNTER — Inpatient Hospital Stay: Payer: 59 | Attending: Oncology

## 2015-08-13 ENCOUNTER — Inpatient Hospital Stay (HOSPITAL_BASED_OUTPATIENT_CLINIC_OR_DEPARTMENT_OTHER): Payer: 59 | Admitting: Family Medicine

## 2015-08-13 ENCOUNTER — Inpatient Hospital Stay: Payer: 59 | Admitting: Oncology

## 2015-08-13 VITALS — BP 143/92 | HR 78 | Temp 96.2°F | Resp 18 | Wt 239.4 lb

## 2015-08-13 DIAGNOSIS — D751 Secondary polycythemia: Secondary | ICD-10-CM | POA: Diagnosis not present

## 2015-08-13 DIAGNOSIS — Z8547 Personal history of malignant neoplasm of testis: Secondary | ICD-10-CM | POA: Diagnosis present

## 2015-08-13 DIAGNOSIS — E291 Testicular hypofunction: Secondary | ICD-10-CM | POA: Diagnosis not present

## 2015-08-13 DIAGNOSIS — L989 Disorder of the skin and subcutaneous tissue, unspecified: Secondary | ICD-10-CM | POA: Insufficient documentation

## 2015-08-13 DIAGNOSIS — Z79899 Other long term (current) drug therapy: Secondary | ICD-10-CM | POA: Insufficient documentation

## 2015-08-13 DIAGNOSIS — Z9079 Acquired absence of other genital organ(s): Secondary | ICD-10-CM | POA: Diagnosis not present

## 2015-08-13 DIAGNOSIS — N400 Enlarged prostate without lower urinary tract symptoms: Secondary | ICD-10-CM | POA: Diagnosis not present

## 2015-08-13 DIAGNOSIS — M109 Gout, unspecified: Secondary | ICD-10-CM | POA: Diagnosis not present

## 2015-08-13 DIAGNOSIS — Z79890 Hormone replacement therapy (postmenopausal): Secondary | ICD-10-CM

## 2015-08-13 LAB — BASIC METABOLIC PANEL
ANION GAP: 8 (ref 5–15)
BUN: 11 mg/dL (ref 6–20)
CALCIUM: 9.5 mg/dL (ref 8.9–10.3)
CO2: 27 mmol/L (ref 22–32)
Chloride: 102 mmol/L (ref 101–111)
Creatinine, Ser: 1.12 mg/dL (ref 0.61–1.24)
GFR calc Af Amer: >60 mL/min (ref >60)
GLUCOSE: 94 mg/dL (ref 65–99)
Potassium: 4 mmol/L (ref 3.5–5.1)
Sodium: 137 mmol/L (ref 135–145)

## 2015-08-13 LAB — CBC WITH DIFFERENTIAL/PLATELET
BASOS ABS: 0.1 10*3/uL (ref 0–0.1)
Basophils Relative: 1 %
EOS ABS: 0.1 10*3/uL (ref 0–0.7)
HCT: 45.7 % (ref 40.0–52.0)
Hemoglobin: 16.4 g/dL (ref 13.0–18.0)
Lymphs Abs: 1.7 10*3/uL (ref 1.0–3.6)
MCH: 31.9 pg (ref 26.0–34.0)
MCHC: 36 g/dL (ref 32.0–36.0)
MCV: 88.6 fL (ref 80.0–100.0)
MONO ABS: 0.6 10*3/uL (ref 0.2–1.0)
Monocytes Relative: 9 %
Neutro Abs: 4.8 10*3/uL (ref 1.4–6.5)
Neutrophils Relative %: 65 %
PLATELETS: 232 10*3/uL (ref 150–440)
RBC: 5.16 MIL/uL (ref 4.40–5.90)
RDW: 12.7 % (ref 11.5–14.5)
WBC: 7.2 10*3/uL (ref 3.8–10.6)

## 2015-08-13 NOTE — Progress Notes (Signed)
[place]  Telephone:(336) [phone]  Fax:(336) [phone]     Tyrone Madden DOB: 22-Mar-1966  MR#: [id number]  CZY#:[id number]  Patient was evaluated on 02/12/2015.  Patient Care Team: Derinda Late, MD as PCP - General (Family Medicine)  CHIEF COMPLAINT:  Chief Complaint  Patient presents with  . testicular cancer   Patient with history of seminoma (2006), status post radical orchiectomy. Patient also with polycythemia secondary to testosterone use.  INTERVAL HISTORY: Patient is here for continued follow-up and treatment consideration regarding a history of a seminoma. Patient currently also followed up for polycythemia secondary to testosterone use. Patient is status post radical orchiectomy of the left testes with prosthetic testes placement. According to notes he also received one cycle of carboplatinum in 2006. Patient reports overall feeling very well. He reports that recently his urologist has decreased his testosterone dose. He is currently receiving Testopel, 6 pellets injected into the buttocks every 3 months. Patient also reports routine self-examinations.   REVIEW OF SYSTEMS:   Review of Systems  Constitutional: Negative for fever, chills, weight loss, malaise/fatigue and diaphoresis.  HENT: Negative for congestion, ear discharge, ear pain, hearing loss, nosebleeds, sore throat and tinnitus.   Eyes: Negative for blurred vision, double vision, photophobia, pain, discharge and redness.  Respiratory: Negative for cough, hemoptysis, sputum production, shortness of breath, wheezing and stridor.   Cardiovascular: Negative for chest pain, palpitations, orthopnea, claudication, leg swelling and PND.  Gastrointestinal: Negative for heartburn, nausea, vomiting, abdominal pain, diarrhea, constipation, blood in stool and melena.  Genitourinary: Negative.   Musculoskeletal: Negative.   Skin: Negative.        Small irregular dry lesion right temporal area.    Neurological: Negative for dizziness, tingling, focal weakness, seizures, weakness and headaches.  Endo/Heme/Allergies: Does not bruise/bleed easily.  Psychiatric/Behavioral: Negative for depression. The patient is not nervous/anxious and does not have insomnia.     As per HPI. Otherwise, a complete review of systems is negatve.   PAST MEDICAL HISTORY: Past Medical History  Diagnosis Date  . Gout   . Hypogonadism in male   . Over weight   . BPH (benign prostatic hyperplasia)   . History of testicular cancer   . Elevated hemoglobin ([place])     PAST SURGICAL HISTORY: Past Surgical History  Procedure Laterality Date  . Orchiectomy unilateral    . Gallbladder surgery      FAMILY HISTORY Family History  Problem Relation Age of Onset  . Heart disease    . Prostate cancer Neg Hx   . Bladder Cancer Neg Hx   . Kidney disease Neg Hx     GYNECOLOGIC HISTORY:  No LMP for male patient.     ADVANCED DIRECTIVES:    HEALTH MAINTENANCE: Social History  Substance Use Topics  . Smoking status: Never Smoker   . Smokeless tobacco: Not on file  . Alcohol Use: 0.0 oz/week    0 Standard drinks or equivalent per week     Comment: occasional     No Known Allergies  Current Outpatient Prescriptions  Medication Sig Dispense Refill  . allopurinol (ZYLOPRIM) 300 MG tablet TAKE 1 TABLET EVERY DAY AS DIRECTED  6  . tadalafil (CIALIS) 20 MG tablet Take 1 tablet (20 mg total) by mouth daily as needed for erectile dysfunction. 6 tablet 12   Current Facility-Administered Medications  Medication Dose Route Frequency Provider Last Rate Last Dose  . Testosterone PLLT 75 mg  75 mg [employer] [facility] [employer], PA-C      .  Testosterone PLLT 75 mg  75 mg Implant Once Shannon A McGowan, PA-C        OBJECTIVE: BP 143/92 mmHg  Pulse 78  Temp(Src) 96.2 F (35.7 C) (Tympanic)  Resp 18  Wt 239 lb 6.7 oz (108.6 kg)   Body mass index is 36.41 kg/(m^2).    ECOG FS:0 - Asymptomatic  General:  Well-developed, well-nourished, no acute distress. Eyes: Pink conjunctiva, anicteric sclera. HEENT: Normocephalic, moist mucous membranes, clear oropharnyx. Lungs: Clear to auscultation bilaterally. Heart: Regular rate and rhythm. No rubs, murmurs, or gallops. Abdomen: Soft, nontender, nondistended. No organomegaly noted, normoactive bowel sounds. Musculoskeletal: No edema, cyanosis, or clubbing. Neuro: Alert, answering all questions appropriately. Cranial nerves grossly intact. Skin: No rashes or petechiae noted. Small irregular lesion right temporal area. Psych: Normal affect. Lymphatics: No cervical or clavicular LAD.   LAB RESULTS:  Appointment on 08/13/2015  Component Date Value Ref Range Status  . WBC 08/13/2015 7.2  3.8 - 10.6 K/uL Final  . RBC 08/13/2015 5.16  4.40 - 5.90 MIL/uL Final  . Hemoglobin 08/13/2015 16.4  13.0 - 18.0 g/dL Final   RESULT REPEATED AND VERIFIED  . HCT 08/13/2015 45.7  40.0 - 52.0 % Final   RESULT REPEATED AND VERIFIED  . MCV 08/13/2015 88.6  80.0 - 100.0 fL Final  . MCH 08/13/2015 31.9  26.0 - 34.0 pg Final  . MCHC 08/13/2015 36.0  32.0 - 36.0 g/dL Final  . RDW 08/13/2015 12.7  11.5 - 14.5 % Final  . Platelets 08/13/2015 232  150 - 440 K/uL Final  . Neutrophils Relative % 08/13/2015 65%   Final  . Neutro Abs 08/13/2015 4.8  1.4 - 6.5 K/uL Final  . Lymphocytes Relative 08/13/2015 23%   Final  . Lymphs Abs 08/13/2015 1.7  1.0 - 3.6 K/uL Final  . Monocytes Relative 08/13/2015 9%   Final  . Monocytes Absolute 08/13/2015 0.6  0.2 - 1.0 K/uL Final  . Eosinophils Relative 08/13/2015 2%   Final  . Eosinophils Absolute 08/13/2015 0.1  0 - 0.7 K/uL Final  . Basophils Relative 08/13/2015 1%   Final  . Basophils Absolute 08/13/2015 0.1  0 - 0.1 K/uL Final  . Sodium 08/13/2015 137  135 - 145 mmol/L Final  . Potassium 08/13/2015 4.0  3.5 - 5.1 mmol/L Final  . Chloride 08/13/2015 102  101 - 111 mmol/L Final  . CO2 08/13/2015 27  22 - 32 mmol/L Final  .  Glucose, Bld 08/13/2015 94  65 - 99 mg/dL Final  . BUN 08/13/2015 11  6 - 20 mg/dL Final  . Creatinine, Ser 08/13/2015 1.12  0.61 - 1.24 mg/dL Final  . Calcium 08/13/2015 9.5  8.9 - 10.3 mg/dL Final  . GFR calc non Af Amer 08/13/2015 >60  >60 mL/min Final  . GFR calc Af Amer 08/13/2015 >60  >60 mL/min Final   Comment: (NOTE) The eGFR has been calculated using the CKD EPI equation. This calculation has not been validated in all clinical situations. eGFR's persistently <60 mL/min signify possible Chronic Kidney Disease.   . Anion gap 08/13/2015 8  5 - 15 Final    STUDIES: No results found.  ASSESSMENT:  Seminoma. Secondary polycythemia.   PLAN:  1. Seminoma. Patient is status post left colectomy with testicular prosthesis placed. Patient has had an ultrasound on 02/06/2015 that revealed no evidence of right testicular mass, torsion, or acute epididymitis. Also no abnormal findings in the left orchiectomy bed. Clinically there is no evidence of recurrent disease.  AFP is pending.  2. Secondary polycythemia. 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 Patient is currently asymptomatic. Goal hct of <50. Patient is currently receiving Testopel 6 pellets inserted in the buttocks every 3 months urologists office. Last injection was 08/08/2015. This has decreased from 15 tablets every 6 months. Hematocrit remains stable, does not require phlebotomy at this time. We will continue to monitor. 3. Skin lesion. Advised patient to see Dermatologist regarding right temporal lesion with dry, scaly, dark pigmentation.   Will continue with reevaluation in 6 months for labs and possible phlebotomy; 12 months for lab, provider, and possible phlebotomy.  Patient expressed understanding and was in agreement with this plan. He also understands that He can call clinic at any time with any questions, concerns, or complaints.   Dr. Grayland Ormond was available for consultation and review of plan of care for this  patient.  Evlyn Kanner, NP   08/13/2015 9:32 AM

## 2015-08-13 NOTE — Progress Notes (Signed)
Pt complains of RUQ pain that has been evaluated by PCP and Zara Council. PCP states is most likely pulled muscle. Zara Council check LFT's and was noted to be elevated. States that pain is intermittent and is not hurting this morning but will hurt on occasion throughout the day.

## 2015-08-14 LAB — AFP TUMOR MARKER: AFP TUMOR MARKER: 2.4 ng/mL (ref 0.0–8.3)

## 2015-11-05 ENCOUNTER — Other Ambulatory Visit: Payer: 59

## 2015-11-05 ENCOUNTER — Other Ambulatory Visit: Payer: Self-pay | Admitting: *Deleted

## 2015-11-05 DIAGNOSIS — E291 Testicular hypofunction: Secondary | ICD-10-CM

## 2015-11-05 DIAGNOSIS — N400 Enlarged prostate without lower urinary tract symptoms: Secondary | ICD-10-CM

## 2015-11-06 LAB — TESTOSTERONE: TESTOSTERONE: 516 ng/dL (ref 264–916)

## 2015-11-06 LAB — PSA: PROSTATE SPECIFIC AG, SERUM: 0.9 ng/mL (ref 0.0–4.0)

## 2015-11-06 LAB — HEMATOCRIT: Hematocrit: 51.7 % — ABNORMAL HIGH (ref 37.5–51.0)

## 2015-11-08 ENCOUNTER — Ambulatory Visit (INDEPENDENT_AMBULATORY_CARE_PROVIDER_SITE_OTHER): Payer: 59 | Admitting: Urology

## 2015-11-08 ENCOUNTER — Encounter: Payer: Self-pay | Admitting: Urology

## 2015-11-08 VITALS — BP 116/79 | HR 61 | Ht 69.0 in | Wt 233.2 lb

## 2015-11-08 DIAGNOSIS — Z8547 Personal history of malignant neoplasm of testis: Secondary | ICD-10-CM

## 2015-11-08 DIAGNOSIS — E291 Testicular hypofunction: Secondary | ICD-10-CM | POA: Diagnosis not present

## 2015-11-08 DIAGNOSIS — N528 Other male erectile dysfunction: Secondary | ICD-10-CM | POA: Diagnosis not present

## 2015-11-08 DIAGNOSIS — N401 Enlarged prostate with lower urinary tract symptoms: Secondary | ICD-10-CM

## 2015-11-08 DIAGNOSIS — N529 Male erectile dysfunction, unspecified: Secondary | ICD-10-CM

## 2015-11-08 DIAGNOSIS — N138 Other obstructive and reflux uropathy: Secondary | ICD-10-CM

## 2015-11-08 NOTE — Progress Notes (Signed)
11/08/2015 8:53 AM   Tyrone Madden 12-24-1965 [id number]  Referring provider: Derinda Late, MD [phone] [address] [facility] [place], [place] [address]  Chief Complaint  Patient presents with  . Hypogonadism    3 month follow up    HPI: Patient is a 50 year old Caucasian male with hypogonadism, erectile dysfunction and BPH with LUTS who presents today for a 3 month follow-up.  Hypogonadism Patient is experiencing a lack of energy, a decreased enjoyment in life, sadness and/or grumpiness.   This is indicated by his responses to the ADAM questionnaire.  He is still having/no longer having spontaneous erections at night.   He does not have sleep apnea. His current testosterone level is 516 ng/dL on 11/05/2015.  He is currently managing his hypogonadism with Testopel.        Androgen Deficiency in the Aging Male    [place] Name 11/08/15 0800         Androgen Deficiency in the Aging Male   Do you have a decrease in libido (sex drive) No     Do you have lack of energy Yes     Do you have a decrease in strength and/or endurance No     Have you lost height No     Have you noticed a decreased "enjoyment of life" Yes     Are you sad and/or grumpy Yes     Are your erections less strong No     Have you noticed a recent deterioration in your ability to play sports No     Are you falling asleep after dinner No     Has there been a recent deterioration in your work performance No       Erectile dysfunction His SHIM score is 24, which is no erectile dysfunction.   He has been having difficulty with erections for a few years.   His major complaint is needed medication.  His libido is diminished.   His risk factors for ED are age, hypogonadism and BPH .  He denies any painful erections or curvatures with his erections.   He has tried Cialis in the past and it is effective.       [place] Name 11/08/15 [id number]         SHIM: Over the last 6 months:    How do you rate your confidence that you could get and keep an erection? High     When you had erections with sexual stimulation, how often were your erections hard enough for penetration (entering your partner)? Almost Always or Always     During sexual intercourse, how often were you able to maintain your erection after you had penetrated (entered) your partner? Not Difficult     During sexual intercourse, how difficult was it to maintain your erection to completion of intercourse? Not Difficult     When you attempted sexual intercourse, how often was it satisfactory for you? Not Difficult       SHIM Total Score   SHIM 24        Score: 1-7 Severe ED 8-11 Moderate ED 12-16 Mild-Moderate ED 17-21 Mild ED 22-25 No ED    BPH WITH LUTS His IPSS score today is 2, which is mild lower urinary tract symptomatology. He is pleased with his quality life due to his urinary symptoms.  He denies any dysuria, hematuria or suprapubic pain.  He also denies any recent fevers, chills, nausea  or vomiting. He does not have a family history of PCa.      IPSS    Row Name 11/08/15 0800         International Prostate Symptom Score   How often have you had the sensation of not emptying your bladder? Not at All     How often have you had to urinate less than every two hours? Not at All     How often have you found you stopped and started again several times when you urinated? Not at All     How often have you found it difficult to postpone urination? Not at All     How often have you had a weak urinary stream? Not at All     How often have you had to strain to start urination? Not at All     How many times did you typically get up at night to urinate? 2 Times     Total IPSS Score 2       Quality of Life due to urinary symptoms   If you were to spend the rest of your life with your urinary condition just the way it is now how would you feel about that? Pleased        Score:  1-7 Mild 8-19  Moderate 20-35 Severe   PMH: Past Medical History:  Diagnosis Date  . BPH (benign prostatic hyperplasia)   . Elevated hemoglobin ([place])   . Gout   . History of testicular cancer   . Hypogonadism in male   . Over weight     Surgical History: Past Surgical History:  Procedure Laterality Date  . GALLBLADDER SURGERY    . orchiectomy unilateral      Home Medications:    Medication List       Accurate as of 11/08/15  8:53 AM. Always use your most recent med list.          allopurinol 300 MG tablet Commonly known as:  ZYLOPRIM   tadalafil 20 MG tablet Commonly known as:  CIALIS Take 1 tablet (20 mg total) by mouth daily as needed for erectile dysfunction.   Testosterone 75 MG Pllt Inject 75 mg into the skin.       Allergies: No Known Allergies  Family History: Family History  Problem Relation Age of Onset  . Heart disease    . Prostate cancer Neg Hx   . Bladder Cancer Neg Hx   . Kidney disease Neg Hx     Social History:  reports that he has never smoked. He has never used smokeless tobacco. He reports that he drinks alcohol. He reports that he does not use drugs.  ROS: UROLOGY Frequent Urination?: No Hard to postpone urination?: No Burning/pain with urination?: No Get up at night to urinate?: No Leakage of urine?: No Urine stream starts and stops?: No Trouble starting stream?: No Do you have to strain to urinate?: No Blood in urine?: No Urinary tract infection?: No Sexually transmitted disease?: No Injury to kidneys or bladder?: No Painful intercourse?: No Weak stream?: No Erection problems?: No Penile pain?: No  Gastrointestinal Nausea?: No Vomiting?: No Indigestion/heartburn?: No Diarrhea?: No Constipation?: No  Constitutional Fever: No Night sweats?: No Weight loss?: No Fatigue?: No  Skin Skin rash/lesions?: No Itching?: No  Eyes Blurred vision?: No Double vision?: No  Ears/Nose/Throat Sore throat?: No Sinus problems?:  No  Hematologic/Lymphatic Swollen glands?: No Easy bruising?: No  Cardiovascular Leg swelling?: No Chest pain?: No  Respiratory Cough?: No Shortness of breath?: No  Endocrine Excessive thirst?: No  Musculoskeletal Back pain?: No Joint pain?: No  Neurological Headaches?: No Dizziness?: No  Psychologic Depression?: No Anxiety?: No  Physical Exam: BP 116/79   Pulse 61   Ht 5\' 9"  (1.753 m)   Wt 233 lb 3.2 oz (105.8 kg)   BMI 34.44 kg/m   Constitutional: Well nourished. Alert and oriented, No acute distress. HEENT: [place] AT, moist mucus membranes. Trachea midline, no masses. Cardiovascular: No clubbing, cyanosis, or edema. Respiratory: Normal respiratory effort, no increased work of breathing. GI: Abdomen is soft, non tender, non distended, no abdominal masses. Liver and spleen not palpable.  No hernias appreciated.  Stool sample for occult testing is not indicated.   GU: No CVA tenderness.  No bladder fullness or masses.  Patient with circumcised phallus.   Urethral meatus is patent.  No penile discharge. No penile lesions or rashes. Scrotum without lesions, cysts, rashes and/or edema.  Right testicle is located scrotally.  Left testicle prothesis in place.  No masses are appreciated in the testicles. Left and right epididymis are normal. Rectal: Patient with  normal sphincter tone. Anus and perineum without scarring or rashes. No rectal masses are appreciated. Prostate is approximately 45 grams, no nodules are appreciated. Seminal vesicles are normal. Skin: No rashes, bruises or suspicious lesions. Lymph: No cervical or inguinal adenopathy. Neurologic: Grossly intact, no focal deficits, moving all 4 extremities. Psychiatric: Normal mood and affect.  Laboratory Data: Lab Results  Component Value Date   WBC 7.2 08/13/2015   HGB 16.4 08/13/2015   HCT 51.7 (H) 11/05/2015   MCV 88.6 08/13/2015   PLT 232 08/13/2015    Lab Results  Component Value Date   CREATININE  1.12 08/13/2015   PSA History  0.8 ng/mL in 11/2014  0.9 ng/mL on 11/05/2015   Lab Results  Component Value Date   TESTOSTERONE 516 11/05/2015     Lab Results  Component Value Date   TSH 2.000 03/07/2015       Component Value Date/Time   CHOL 214 (H) 03/07/2015 0813   HDL 55 03/07/2015 0813   CHOLHDL 3.9 03/07/2015 0813   LDLCALC 136 (H) 03/07/2015 0813    Lab Results  Component Value Date   AST 39 03/07/2015   Lab Results  Component Value Date   ALT 72 (H) 03/07/2015    Assessment & Plan:    1. Hypogonadism:     -most recent testosterone level is 516 ng/dL on 11/05/2015  -continue Testopel insertion, RTC in 3 months for insertion   2. BPH with LUTS  - IPSS score is 2/1  - Continue conservative management, avoiding bladder irritants and timed voiding's  - RTC in 6 months for IPSS, PSA and exam, as testosterone therapy can cause prostate enlargement and worsen LUTS  3. Erectile dysfunction:     -SHIM score is 24  -continue Cialis 20 mg, prn   -RTC in 6 months for SHIM score and exam, as testosterone therapy can affect erections  4. History of testicular cancer  - followed by the cancer center  Return in about 3 months (around 02/08/2016) for schedule Testopel.  These notes generated with voice recognition software. I apologize for typographical errors.  Zara Council, [facility] [address] [place], [place] [address] [phone]

## 2016-01-30 ENCOUNTER — Telehealth: Payer: Self-pay | Admitting: Internal Medicine

## 2016-01-30 NOTE — Telephone Encounter (Signed)
Dr Mike Gip is on call today , so this message is being forwarded to her as he is not assigned to a specific MD at this point in time

## 2016-01-30 NOTE — Telephone Encounter (Signed)
Pt says he usually has an [place] and xray around this time of year. He was a [place] pt. He wants to know if he still needs to do that. Please advise. Currently scheduled to see covering provider next May with some lab/poss phleb in November. No other MD currently following. Thanks.

## 2016-02-02 NOTE — Telephone Encounter (Signed)
  Reviewed patient's chart.  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  Unclear why he is having a testicular ultrasound yearly.  He should have labs:  CBC with diff, ferritin, CMP, AFP, beta-HCG, LDH.  He undergoes phlebotomy if his HCT > 50.  I would be happy to see him.  M

## 2016-02-03 NOTE — Telephone Encounter (Signed)
I have left message for patient to return my call.  

## 2016-02-06 ENCOUNTER — Ambulatory Visit (INDEPENDENT_AMBULATORY_CARE_PROVIDER_SITE_OTHER): Payer: 59 | Admitting: Urology

## 2016-02-06 ENCOUNTER — Encounter: Payer: Self-pay | Admitting: Urology

## 2016-02-06 VITALS — BP 118/81 | HR 66 | Ht 68.0 in | Wt 234.5 lb

## 2016-02-06 DIAGNOSIS — E291 Testicular hypofunction: Secondary | ICD-10-CM

## 2016-02-06 MED ORDER — TESTOSTERONE 75 MG IL PLLT
75.0000 mg | PELLET | Freq: Once | Status: AC
  Administered 2016-02-06: 75 mg

## 2016-02-06 NOTE — Progress Notes (Signed)
This is a 50 -year-old Caucasian male with hypogonadism and he is managed with Testopel. He presents today for Testopel insertion.  Patient is placed on the exam table in the right lateral jackknife position.  Identified upper outer quadrant of hip for insertion; prepped area with Betadine and injected 10 cc's of Lidocaine 1% with Epinephrine to anesthetize superficially and distally along trocar tract.  Made 3 mm incision using 15 blade of scalpel; trocar with sharp ended stylet was inserted into subcutaneous tissue in line with femur. Sharp stylet was withdrawn and 6 pellets were placed into trocar well. Testopel pellets advanced into tissue using blunt ended stylet. Trocar removed and incision closed using 6 Steri-Strips. Cleansed area to remove Betadine and covered Steri-Strips with outer Band-Aid.  Careful inspection of insertion is done and patient informed of post procedure instructions.  Per Dr. Mike Gip, patient will need to undergo therapeutic phlebotomy if his HCT > 50 %.   Patient will RTC in 3 months for an office visit, testosterone, HCT, PSA to be drawn before appointment.  We did briefly discuss Natesto which may cause less of a rise in his HCT, but he was not interested at this time.

## 2016-02-11 ENCOUNTER — Inpatient Hospital Stay: Payer: 59

## 2016-02-11 ENCOUNTER — Inpatient Hospital Stay: Payer: 59 | Attending: Internal Medicine

## 2016-02-11 DIAGNOSIS — Z9079 Acquired absence of other genital organ(s): Secondary | ICD-10-CM | POA: Insufficient documentation

## 2016-02-11 DIAGNOSIS — D751 Secondary polycythemia: Secondary | ICD-10-CM | POA: Diagnosis not present

## 2016-02-11 DIAGNOSIS — Z79890 Hormone replacement therapy (postmenopausal): Secondary | ICD-10-CM | POA: Diagnosis not present

## 2016-02-11 DIAGNOSIS — M109 Gout, unspecified: Secondary | ICD-10-CM | POA: Diagnosis not present

## 2016-02-11 DIAGNOSIS — E291 Testicular hypofunction: Secondary | ICD-10-CM | POA: Diagnosis not present

## 2016-02-11 DIAGNOSIS — Z8547 Personal history of malignant neoplasm of testis: Secondary | ICD-10-CM | POA: Insufficient documentation

## 2016-02-11 DIAGNOSIS — L989 Disorder of the skin and subcutaneous tissue, unspecified: Secondary | ICD-10-CM | POA: Diagnosis not present

## 2016-02-11 DIAGNOSIS — N400 Enlarged prostate without lower urinary tract symptoms: Secondary | ICD-10-CM | POA: Insufficient documentation

## 2016-02-11 LAB — HEMATOCRIT: HEMATOCRIT: 46.3 % (ref 40.0–52.0)

## 2016-05-04 ENCOUNTER — Other Ambulatory Visit: Payer: Self-pay

## 2016-05-04 DIAGNOSIS — E291 Testicular hypofunction: Secondary | ICD-10-CM

## 2016-05-05 ENCOUNTER — Other Ambulatory Visit: Payer: 59

## 2016-05-05 DIAGNOSIS — E291 Testicular hypofunction: Secondary | ICD-10-CM

## 2016-05-06 LAB — HEMATOCRIT: Hematocrit: 50.7 % (ref 37.5–51.0)

## 2016-05-06 LAB — TESTOSTERONE: Testosterone: 327 ng/dL (ref 264–916)

## 2016-05-06 LAB — PSA: Prostate Specific Ag, Serum: 0.9 ng/mL (ref 0.0–4.0)

## 2016-05-07 ENCOUNTER — Other Ambulatory Visit: Payer: 59

## 2016-05-07 ENCOUNTER — Telehealth: Payer: Self-pay | Admitting: *Deleted

## 2016-05-07 NOTE — Telephone Encounter (Signed)
Spoke with patient and let him know I received his approval for Testopel. Patient states he will call back next week to make an appointment.

## 2016-05-14 ENCOUNTER — Encounter: Payer: Self-pay | Admitting: Urology

## 2016-05-14 ENCOUNTER — Ambulatory Visit: Payer: 59 | Admitting: Urology

## 2016-05-14 VITALS — BP 107/74 | HR 70 | Ht 68.0 in | Wt 244.3 lb

## 2016-05-14 DIAGNOSIS — N138 Other obstructive and reflux uropathy: Secondary | ICD-10-CM | POA: Diagnosis not present

## 2016-05-14 DIAGNOSIS — N401 Enlarged prostate with lower urinary tract symptoms: Secondary | ICD-10-CM

## 2016-05-14 DIAGNOSIS — E291 Testicular hypofunction: Secondary | ICD-10-CM | POA: Diagnosis not present

## 2016-05-14 DIAGNOSIS — N529 Male erectile dysfunction, unspecified: Secondary | ICD-10-CM | POA: Diagnosis not present

## 2016-05-14 NOTE — Progress Notes (Signed)
05/14/2016 8:49 AM   Jeri Lager [birth year] [id number]  Referring provider: Derinda Late, MD [phone] [address] [facility] [place], [place] [address]  Chief Complaint  Patient presents with  . Hypogonadism    6 month follow up    HPI: Patient is a 51 year old Caucasian male with hypogonadism, erectile dysfunction and BPH with LUTS who presents today for a 6 month follow-up.  Hypogonadism Patient is experiencing a lack of energy and a decrease in strength.  This is indicated by his responses to the ADAM questionnaire.   He is still having spontaneous erections at night.   He does not have sleep apnea.   His most recent testosterone level was 327 ng/dL on 05/05/2016.   He is currently managing his hypogonadism with Testopel insertion.       Androgen Deficiency in the Aging Male    [place] Name 05/14/16 0800         Androgen Deficiency in the Aging Male   Do you have a decrease in libido (sex drive) No     Do you have lack of energy Yes     Do you have a decrease in strength and/or endurance Yes     Have you lost height No     Have you noticed a decreased "enjoyment of life" No     Are you sad and/or grumpy No     Are your erections less strong No     Have you noticed a recent deterioration in your ability to play sports No     Are you falling asleep after dinner No     Has there been a recent deterioration in your work performance No        Erectile dysfunction His SHIM score is 23, which is no erectile dysfunction.   His previous SHIM 24.  He has been having difficulty with erections for a few years.   His major complaint is needed medication.  His libido is diminished.   His risk factors for ED are age, hypogonadism and BPH .  He denies any painful erections or curvatures with his erections.   He has tried Cialis in the past and it is effective.       [place] Name 05/14/16 [phone]         SHIM: Over the last 6 months:   How  do you rate your confidence that you could get and keep an erection? Moderate     When you had erections with sexual stimulation, how often were your erections hard enough for penetration (entering your partner)? Almost Always or Always     During sexual intercourse, how often were you able to maintain your erection after you had penetrated (entered) your partner? Not Difficult     During sexual intercourse, how difficult was it to maintain your erection to completion of intercourse? Not Difficult     When you attempted sexual intercourse, how often was it satisfactory for you? Not Difficult       SHIM Total Score   SHIM 23        Score: 1-7 Severe ED 8-11 Moderate ED 12-16 Mild-Moderate ED 17-21 Mild ED 22-25 No ED    BPH WITH LUTS His IPSS score today is 4, which is mild lower urinary tract symptomatology. He is pleased with his quality life due to his urinary symptoms.  His previous I PSS score was 2/2.  He denies any dysuria,  hematuria or suprapubic pain.  He also denies any recent fevers, chills, nausea or vomiting.  He does not have a family history of PCa.     IPSS    Row Name 05/14/16 0800         International Prostate Symptom Score   How often have you had the sensation of not emptying your bladder? Not at All     How often have you had to urinate less than every two hours? Less than 1 in 5 times     How often have you found you stopped and started again several times when you urinated? Less than half the time     How often have you found it difficult to postpone urination? Not at All     How often have you had a weak urinary stream? Not at All     How often have you had to strain to start urination? Not at All     How many times did you typically get up at night to urinate? 1 Time     Total IPSS Score 4       Quality of Life due to urinary symptoms   If you were to spend the rest of your life with your urinary condition just the way it is now how would you feel  about that? Mostly Satisfied        Score:  1-7 Mild 8-19 Moderate 20-35 Severe   PMH: Past Medical History:  Diagnosis Date  . BPH (benign prostatic hyperplasia)   . Elevated hemoglobin ([place])   . Gout   . History of testicular cancer   . Hypogonadism in male   . Over weight     Surgical History: Past Surgical History:  Procedure Laterality Date  . GALLBLADDER SURGERY    . orchiectomy unilateral      Home Medications:  Allergies as of 05/14/2016   No Known Allergies     Medication List       Accurate as of 05/14/16  8:49 AM. Always use your most recent med list.          allopurinol 300 MG tablet Commonly known as:  ZYLOPRIM   aspirin-acetaminophen-caffeine 250-250-65 MG tablet Commonly known as:  EXCEDRIN MIGRAINE Take 1 tablet by mouth every 6 (six) hours as needed for headache.   tadalafil 20 MG tablet Commonly known as:  CIALIS Take 1 tablet (20 mg total) by mouth daily as needed for erectile dysfunction.   Testosterone 75 MG Pllt Inject 75 mg into the skin.       Allergies: No Known Allergies  Family History: Family History  Problem Relation Age of Onset  . Heart disease    . Prostate cancer Neg Hx   . Bladder Cancer Neg Hx   . Kidney disease Neg Hx   . Kidney cancer Neg Hx     Social History:  reports that he has never smoked. He has never used smokeless tobacco. He reports that he drinks alcohol. He reports that he does not use drugs.  ROS: UROLOGY Frequent Urination?: No Hard to postpone urination?: No Burning/pain with urination?: No Get up at night to urinate?: No Leakage of urine?: No Urine stream starts and stops?: No Trouble starting stream?: No Do you have to strain to urinate?: No Blood in urine?: No Urinary tract infection?: No Sexually transmitted disease?: No Injury to kidneys or bladder?: No Painful intercourse?: No Weak stream?: No Erection problems?: No Penile pain?: No  Gastrointestinal Nausea?:  No Vomiting?: No Indigestion/heartburn?: No Diarrhea?: No Constipation?: No  Constitutional Fever: No Night sweats?: No Weight loss?: No Fatigue?: No  Skin Skin rash/lesions?: No Itching?: No  Eyes Blurred vision?: No Double vision?: No  Ears/Nose/Throat Sore throat?: No Sinus problems?: No  Hematologic/Lymphatic Swollen glands?: No Easy bruising?: No  Cardiovascular Leg swelling?: No Chest pain?: No  Respiratory Cough?: No Shortness of breath?: No  Endocrine Excessive thirst?: No  Musculoskeletal Back pain?: No Joint pain?: No  Neurological Headaches?: No Dizziness?: No  Psychologic Depression?: No Anxiety?: No  Physical Exam: BP 107/74   Pulse 70   Ht 5\' 8"  (1.727 m)   Wt 244 lb 4.8 oz (110.8 kg)   BMI 37.15 kg/m   Constitutional: Well nourished. Alert and oriented, No acute distress. HEENT: [place] AT, moist mucus membranes. Trachea midline, no masses. Cardiovascular: No clubbing, cyanosis, or edema. Respiratory: Normal respiratory effort, no increased work of breathing. GI: Abdomen is soft, non tender, non distended, no abdominal masses. Liver and spleen not palpable.  No hernias appreciated.  Stool sample for occult testing is not indicated.   GU: No CVA tenderness.  No bladder fullness or masses.  Patient with circumcised phallus.   Urethral meatus is patent.  No penile discharge. No penile lesions or rashes. Scrotum without lesions, cysts, rashes and/or edema.  Right testicle is located scrotally.  Left testicle prothesis in place.  No masses are appreciated in the testicles. Left and right epididymis are normal. Rectal: Patient with  normal sphincter tone. Anus and perineum without scarring or rashes. No rectal masses are appreciated. Prostate is approximately 45 grams, no nodules are appreciated. Seminal vesicles are normal. Skin: No rashes, bruises or suspicious lesions. Lymph: No cervical or inguinal adenopathy. Neurologic: Grossly intact, no  focal deficits, moving all 4 extremities. Psychiatric: Normal mood and affect.  Laboratory Data: Lab Results  Component Value Date   WBC 7.2 08/13/2015   HGB 16.4 08/13/2015   HCT 50.7 05/05/2016   MCV 88.6 08/13/2015   PLT 232 08/13/2015    Lab Results  Component Value Date   CREATININE 1.12 08/13/2015   PSA History  0.8 ng/mL in 11/2014  0.9 ng/mL on 11/05/2015  0.9 ng/mL on 05/05/2016   Lab Results  Component Value Date   TESTOSTERONE 327 05/05/2016     Lab Results  Component Value Date   TSH 2.000 03/07/2015       Component Value Date/Time   CHOL 214 (H) 03/07/2015 0813   HDL 55 03/07/2015 0813   CHOLHDL 3.9 03/07/2015 0813   LDLCALC 136 (H) 03/07/2015 0813    Lab Results  Component Value Date   AST 39 03/07/2015   Lab Results  Component Value Date   ALT 72 (H) 03/07/2015    Assessment & Plan:    1. Hypogonadism:     -most recent testosterone level is 327 ng/dL on 05/05/16  -continue Testopel insertion, RTC for insertion   2. BPH with LUTS  - IPSS score is 4/2, it is worsening  - Continue conservative management, avoiding bladder irritants and timed voiding's  - RTC in 6 months for IPSS, PSA and exam, as testosterone therapy can cause prostate enlargement and worsen LUTS  3. Erectile dysfunction:     -SHIM score is 23  -continue Cialis 20 mg, on demand dosing   -RTC in 6 months for SHIM score and exam, as testosterone therapy can affect erections  4. History of testicular cancer  - followed by the cancer center  Return for Testopel insertion.  These notes generated with voice recognition software. I apologize for typographical errors.  Zara Council, [facility] [address] [place], [place] [address] [phone]

## 2016-05-22 DIAGNOSIS — M10071 Idiopathic gout, right ankle and foot: Secondary | ICD-10-CM | POA: Diagnosis not present

## 2016-06-01 ENCOUNTER — Ambulatory Visit: Payer: 59 | Admitting: Urology

## 2016-06-01 DIAGNOSIS — R739 Hyperglycemia, unspecified: Secondary | ICD-10-CM | POA: Diagnosis not present

## 2016-06-01 DIAGNOSIS — E349 Endocrine disorder, unspecified: Secondary | ICD-10-CM | POA: Diagnosis not present

## 2016-06-01 DIAGNOSIS — E7800 Pure hypercholesterolemia, unspecified: Secondary | ICD-10-CM | POA: Diagnosis not present

## 2016-06-01 DIAGNOSIS — Z0000 Encounter for general adult medical examination without abnormal findings: Secondary | ICD-10-CM | POA: Diagnosis not present

## 2016-06-01 DIAGNOSIS — Z79899 Other long term (current) drug therapy: Secondary | ICD-10-CM | POA: Diagnosis not present

## 2016-06-02 ENCOUNTER — Ambulatory Visit: Payer: 59 | Admitting: Urology

## 2016-06-02 ENCOUNTER — Encounter: Payer: Self-pay | Admitting: Urology

## 2016-06-02 VITALS — BP 134/85 | HR 119 | Ht 68.0 in | Wt 241.7 lb

## 2016-06-02 DIAGNOSIS — E291 Testicular hypofunction: Secondary | ICD-10-CM

## 2016-06-02 MED ORDER — TESTOSTERONE 75 MG IL PLLT
75.0000 mg | PELLET | Freq: Once | Status: AC
  Administered 2016-06-02: 75 mg

## 2016-06-02 NOTE — Progress Notes (Signed)
06/02/2016 3:26 PM   [place] Madden Feb 18, 1966 [id number]  Referring provider: Derinda Late, MD [phone] [facility] [place], [place] [address]  Chief Complaint  Patient presents with  . Hypogonadism    Testopel    HPI: Patient is a 51 year old Caucasian male with hypogonadism, erectile dysfunction and BPH with LUTS who presents today for a Testopel insertion.    Hypogonadism Patient is experiencing a lack of energy and a decrease in strength.  This is indicated by his responses to the ADAM questionnaire.  He is still having spontaneous erections at night.   He does not have sleep apnea.   His most recent testosterone level was 327 ng/dL on 05/05/2016.   He is currently managing his hypogonadism with Testopel insertion.       Androgen Deficiency in the Aging Male    [place] Name 05/14/16 0800         Androgen Deficiency in the Aging Male   Do you have a decrease in libido (sex drive) No     Do you have lack of energy Yes     Do you have a decrease in strength and/or endurance Yes     Have you lost height No     Have you noticed a decreased "enjoyment of life" No     Are you sad and/or grumpy No     Are your erections less strong No     Have you noticed a recent deterioration in your ability to play sports No     Are you falling asleep after dinner No     Has there been a recent deterioration in your work performance No        PMH: Past Medical History:  Diagnosis Date  . BPH (benign prostatic hyperplasia)   . Elevated hemoglobin ([place])   . Gout   . History of testicular cancer   . Hypogonadism in male   . Over weight     Surgical History: Past Surgical History:  Procedure Laterality Date  . GALLBLADDER SURGERY    . orchiectomy unilateral      Home Medications:  Allergies as of 06/02/2016   No Known Allergies     Medication List       Accurate as of 06/02/16  3:26 PM. Always use your most recent med list.          allopurinol 300 MG tablet Commonly known as:  ZYLOPRIM   aspirin-acetaminophen-caffeine 250-250-65 MG tablet Commonly known as:  EXCEDRIN MIGRAINE Take 1 tablet by mouth every 6 (six) hours as needed for headache.   indomethacin 50 MG capsule Commonly known as:  INDOCIN Take 50 mg by mouth 2 (two) times daily with a meal.   tadalafil 20 MG tablet Commonly known as:  CIALIS Take 1 tablet (20 mg total) by mouth daily as needed for erectile dysfunction.   Testosterone 75 MG Pllt Inject 75 mg into the skin.       Allergies: No Known Allergies  Family History: Family History  Problem Relation Age of Onset  . Heart disease    . Prostate cancer Neg Hx   . Bladder Cancer Neg Hx   . Kidney disease Neg Hx   . Kidney cancer Neg Hx     Social History:  reports that he has never smoked. He has never used smokeless tobacco. He reports that he drinks alcohol. He reports that he does not use drugs.  ROS: UROLOGY  Frequent Urination?: No Hard to postpone urination?: No Burning/pain with urination?: No Get up at night to urinate?: No Leakage of urine?: No Urine stream starts and stops?: No Trouble starting stream?: No Do you have to strain to urinate?: No Blood in urine?: No Urinary tract infection?: No Sexually transmitted disease?: No Injury to kidneys or bladder?: No Painful intercourse?: No Weak stream?: No Erection problems?: No Penile pain?: No  Gastrointestinal Nausea?: No Vomiting?: No Indigestion/heartburn?: No Diarrhea?: No Constipation?: No  Constitutional Fever: No Night sweats?: No Weight loss?: No Fatigue?: No  Skin Skin rash/lesions?: No Itching?: No  Eyes Blurred vision?: No Double vision?: No  Ears/Nose/Throat Sore throat?: No Sinus problems?: No  Hematologic/Lymphatic Swollen glands?: No Easy bruising?: No  Cardiovascular Leg swelling?: No Chest pain?: No  Respiratory Cough?: No Shortness of breath?:  No  Endocrine Excessive thirst?: No  Musculoskeletal Back pain?: No Joint pain?: No  Neurological Headaches?: No Dizziness?: No  Psychologic Depression?: No Anxiety?: No  Physical Exam: BP 134/85   Pulse (!) 119   Ht 5\' 8"  (1.727 m)   Wt 241 lb 11.2 oz (109.6 kg)   BMI 36.75 kg/m   Constitutional: Well nourished. Alert and oriented, No acute distress. HEENT: [place] AT, moist mucus membranes. Trachea midline, no masses. Cardiovascular: No clubbing, cyanosis, or edema. Respiratory: Normal respiratory effort, no increased work of breathing. Skin: No rashes, bruises or suspicious lesions. Lymph: No cervical or inguinal adenopathy. Neurologic: Grossly intact, no focal deficits, moving all 4 extremities. Psychiatric: Normal mood and affect.  Laboratory Data: Lab Results  Component Value Date   WBC 7.2 08/13/2015   HGB 16.4 08/13/2015   HCT 50.7 05/05/2016   MCV 88.6 08/13/2015   PLT 232 08/13/2015    Lab Results  Component Value Date   CREATININE 1.12 08/13/2015   PSA History  0.8 ng/mL in 11/2014  0.9 ng/mL on 11/05/2015  0.9 ng/mL on 05/05/2016   Lab Results  Component Value Date   TESTOSTERONE 327 05/05/2016     Lab Results  Component Value Date   TSH 2.000 03/07/2015       Component Value Date/Time   CHOL 214 (H) 03/07/2015 0813   HDL 55 03/07/2015 0813   CHOLHDL 3.9 03/07/2015 0813   LDLCALC 136 (H) 03/07/2015 0813    Lab Results  Component Value Date   AST 39 03/07/2015   Lab Results  Component Value Date   ALT 72 (H) 03/07/2015    Procedure Patient is placed on the exam table in the left lateral jackknife position.   Identified upper outer quadrant of hip for insertion; prepped area with Betadine and injected 10 cc's of Lidocaine 1% with Epinephrine to anesthetize superficially and distally along trocar tract. Made 3 mm incision using 15 blade of scalpel; trocar with sharp ended stylet was inserted into subcutaneous tissue in line with  femur. Sharp stylet was withdrawn and 6pellets were placed into trocar well. Testopel pellets advanced into tissue using blunt ended stylet. Trocar removed and incision closed using 6 Steri-Strips. Cleansed area to remove Betadine and covered Steri-Strips with outer Band-Aid.  Careful inspection of insertion is done and patient informed of post procedure instructions.   Assessment & Plan:    1. Hypogonadism:     -most recent testosterone level is 327 ng/dL on 05/05/16  - 6 pellets inserted today  - RTC in 3 months for HCT and testosterone level only  - RTC in 6 months for testosterone, LFT's, HCT, PSA to be  drawn before appointment and ADAM, I PSS and SHIM  , Return in about 3 months (around 08/30/2016) for HCT and testosterone level.  These notes generated with voice recognition software. I apologize for typographical errors.  Zara Council, [facility] [address] [place], [place] [address] [phone]

## 2016-06-02 NOTE — Addendum Note (Signed)
Addended by: Orlene Erm on: 06/02/2016 03:31 PM   Modules accepted: Orders

## 2016-07-08 DIAGNOSIS — Z1211 Encounter for screening for malignant neoplasm of colon: Secondary | ICD-10-CM | POA: Diagnosis not present

## 2016-07-08 DIAGNOSIS — Z8371 Family history of colonic polyps: Secondary | ICD-10-CM | POA: Diagnosis not present

## 2016-08-11 DIAGNOSIS — Z1211 Encounter for screening for malignant neoplasm of colon: Secondary | ICD-10-CM | POA: Diagnosis not present

## 2016-08-11 DIAGNOSIS — Z8371 Family history of colonic polyps: Secondary | ICD-10-CM | POA: Diagnosis not present

## 2016-08-11 DIAGNOSIS — K5730 Diverticulosis of large intestine without perforation or abscess without bleeding: Secondary | ICD-10-CM | POA: Diagnosis not present

## 2016-08-14 ENCOUNTER — Inpatient Hospital Stay: Payer: 59 | Admitting: Oncology

## 2016-08-14 ENCOUNTER — Inpatient Hospital Stay: Payer: 59

## 2016-08-25 ENCOUNTER — Inpatient Hospital Stay: Payer: 59

## 2016-08-25 ENCOUNTER — Encounter: Payer: Self-pay | Admitting: Oncology

## 2016-08-25 ENCOUNTER — Inpatient Hospital Stay: Payer: 59 | Attending: Oncology | Admitting: Oncology

## 2016-08-25 ENCOUNTER — Other Ambulatory Visit: Payer: Self-pay

## 2016-08-25 VITALS — BP 127/83 | HR 73 | Temp 98.1°F | Resp 16 | Ht 68.0 in | Wt 236.4 lb

## 2016-08-25 DIAGNOSIS — E663 Overweight: Secondary | ICD-10-CM | POA: Insufficient documentation

## 2016-08-25 DIAGNOSIS — N400 Enlarged prostate without lower urinary tract symptoms: Secondary | ICD-10-CM | POA: Insufficient documentation

## 2016-08-25 DIAGNOSIS — D751 Secondary polycythemia: Secondary | ICD-10-CM | POA: Diagnosis not present

## 2016-08-25 DIAGNOSIS — Z79899 Other long term (current) drug therapy: Secondary | ICD-10-CM | POA: Insufficient documentation

## 2016-08-25 DIAGNOSIS — Z8547 Personal history of malignant neoplasm of testis: Secondary | ICD-10-CM | POA: Diagnosis not present

## 2016-08-25 DIAGNOSIS — E291 Testicular hypofunction: Secondary | ICD-10-CM | POA: Diagnosis not present

## 2016-08-25 DIAGNOSIS — M109 Gout, unspecified: Secondary | ICD-10-CM | POA: Diagnosis not present

## 2016-08-25 DIAGNOSIS — R7401 Elevation of levels of liver transaminase levels: Secondary | ICD-10-CM

## 2016-08-25 DIAGNOSIS — R740 Nonspecific elevation of levels of transaminase and lactic acid dehydrogenase [LDH]: Secondary | ICD-10-CM

## 2016-08-25 LAB — COMPREHENSIVE METABOLIC PANEL
ALT: 65 U/L — ABNORMAL HIGH (ref 17–63)
AST: 41 U/L (ref 15–41)
Albumin: 4.6 g/dL (ref 3.5–5.0)
Alkaline Phosphatase: 54 U/L (ref 38–126)
Anion gap: 3 — ABNORMAL LOW (ref 5–15)
BUN: 12 mg/dL (ref 6–20)
CO2: 28 mmol/L (ref 22–32)
Calcium: 9.5 mg/dL (ref 8.9–10.3)
Chloride: 106 mmol/L (ref 101–111)
Creatinine, Ser: 1.08 mg/dL (ref 0.61–1.24)
GFR calc Af Amer: >60 mL/min (ref >60)
GFR calc non Af Amer: >60 mL/min (ref >60)
Glucose, Bld: 101 mg/dL — ABNORMAL HIGH (ref 65–99)
Potassium: 4.1 mmol/L (ref 3.5–5.1)
Sodium: 137 mmol/L (ref 135–145)
Total Bilirubin: 1.3 mg/dL — ABNORMAL HIGH (ref 0.3–1.2)
Total Protein: 7.7 g/dL (ref 6.5–8.1)

## 2016-08-25 LAB — CBC WITH DIFFERENTIAL/PLATELET
Basophils Absolute: 0.1 10*3/uL (ref 0–0.1)
Basophils Relative: 1 %
Eosinophils Absolute: 0.1 10*3/uL (ref 0–0.7)
Eosinophils Relative: 2 %
HCT: 48.3 % (ref 40.0–52.0)
Hemoglobin: 17.3 g/dL (ref 13.0–18.0)
Lymphocytes Relative: 22 %
Lymphs Abs: 1.3 10*3/uL (ref 1.0–3.6)
MCH: 32.3 pg (ref 26.0–34.0)
MCHC: 35.8 g/dL (ref 32.0–36.0)
MCV: 90.2 fL (ref 80.0–100.0)
Monocytes Absolute: 0.5 10*3/uL (ref 0.2–1.0)
Monocytes Relative: 9 %
Neutro Abs: 4 10*3/uL (ref 1.4–6.5)
Neutrophils Relative %: 66 %
Platelets: 198 10*3/uL (ref 150–440)
RBC: 5.36 MIL/uL (ref 4.40–5.90)
RDW: 13.4 % (ref 11.5–14.5)
WBC: 6 10*3/uL (ref 3.8–10.6)

## 2016-08-25 NOTE — Progress Notes (Signed)
Patient reports pain to upper right abdomen. Started  Weeks ago. Reports nothing initial  triggered pain. Rates 2 /10. Reports not taking any medication and states at rest it stay 2/10.

## 2016-08-25 NOTE — Progress Notes (Signed)
Hematology/Oncology Consult note [facility]  [phone] [phone]  Patient Care Team: Derinda Late, MD as PCP - General (Family Medicine)   Name of the patient: [place] Madden  [id number]  1965-06-07   Date of visit: 08/25/16  Diagnosis- 1. H/o seminoma in 2006  2. 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 According to notes he also received one cycle of carboplatinum in 2006. Patient reports overall feeling very well.  2. Patient is on testosterone supplements through urology for hypogonadism. He has developed secondary polycythemia from it. Previously he was getting 15 pellets every 6 months. Now gets 6 every 3 months  Interval history- feels that testosterone improves his fatigue for a few days then it is back to square one. Denies other complaints   Review of systems- Review of Systems  Constitutional: Negative for chills, fever, malaise/fatigue and weight loss.  HENT: Negative for congestion, ear discharge and nosebleeds.   Eyes: Negative for blurred vision.  Respiratory: Negative for cough, hemoptysis, sputum production, shortness of breath and wheezing.   Cardiovascular: Negative for chest pain, palpitations, orthopnea and claudication.  Gastrointestinal: Negative for abdominal pain, blood in stool, constipation, diarrhea, heartburn, melena, nausea and vomiting.  Genitourinary: Negative for dysuria, flank pain, frequency, hematuria and urgency.  Musculoskeletal: Negative for back pain, joint pain and myalgias.  Skin: Negative for rash.  Neurological: Negative for dizziness, tingling, focal weakness, seizures, weakness and headaches.  Endo/Heme/Allergies: Does not bruise/bleed easily.  Psychiatric/Behavioral: Negative for depression and  suicidal ideas. The patient does not have insomnia.       No Known Allergies   Past Medical History:  Diagnosis Date  . BPH (benign prostatic hyperplasia)   . Elevated hemoglobin ([place])   . Gout   . History of testicular cancer   . Hypogonadism in male   . Over weight      Past Surgical History:  Procedure Laterality Date  . GALLBLADDER SURGERY    . orchiectomy unilateral      Social History   Social History  . Marital status: Married    Spouse name: N/A  . Number of children: N/A  . Years of education: N/A   Occupational History  . Not on file.   Social History Main Topics  . Smoking status: Never Smoker  . Smokeless tobacco: Never Used  . Alcohol use 0.0 oz/week     Comment: occasional  . Drug use: No  . Sexual activity: Not on file   Other Topics Concern  . Not on file   Social History Narrative  . No narrative on file    Family History  Problem Relation Age of Onset  . Heart disease Unknown   . Prostate cancer Neg Hx   . Bladder Cancer Neg Hx   . Kidney disease Neg Hx   . Kidney cancer Neg Hx      Current Outpatient Prescriptions:  .  allopurinol (ZYLOPRIM) 300 MG tablet, , Disp: , Rfl:  .  aspirin-acetaminophen-caffeine (EXCEDRIN MIGRAINE) 250-250-65 MG tablet, Take 1 tablet by mouth every 6 (six) hours as needed for headache., Disp: , Rfl:  .  indomethacin (INDOCIN) 50 MG capsule, Take 50 mg by mouth 2 (two) times daily with a meal., Disp: , Rfl:  .  tadalafil (CIALIS) 20 MG tablet, Take 1 tablet (20 mg total)  by mouth daily as needed for erectile dysfunction. (Patient not taking: Reported on 02/06/2016), Disp: 6 tablet, Rfl: 12 .  Testosterone 75 MG PLLT, Inject 75 mg into the skin., Disp: , Rfl:   Current Facility-Administered Medications:  .  Testosterone PLLT 75 mg, 75 mg, Implant, Once, McGowan, Shannon A, PA-C .  Testosterone PLLT 75 mg, 75 mg, Implant, Once, McGowan, Shannon A, PA-C  Physical exam:  Vitals:   08/25/16 0900  BP:  127/83  Pulse: 73  Resp: 16  Temp: 98.1 F (36.7 C)  TempSrc: Tympanic  SpO2: 95%  Weight: 236 lb 6.4 oz (107.2 kg)  Height: 5\' 8"  (1.727 m)   Physical Exam  Constitutional: He is oriented to person, place, and time and well-developed, well-nourished, and in no distress.  HENT:  Head: Normocephalic and atraumatic.  Eyes: EOM are normal. Pupils are equal, round, and reactive to light.  Neck: Normal range of motion.  Cardiovascular: Normal rate, regular rhythm and normal heart sounds.   Pulmonary/Chest: Effort normal and breath sounds normal.  Abdominal: Soft. Bowel sounds are normal.  Neurological: He is alert and oriented to person, place, and time.  Skin: Skin is warm and dry.     CMP Latest Ref Rng & Units 08/25/2016  Glucose 65 - 99 mg/dL 101(H)  BUN 6 - 20 mg/dL 12  Creatinine 0.61 - 1.24 mg/dL 1.08  Sodium 135 - 145 mmol/L 137  Potassium 3.5 - 5.1 mmol/L 4.1  Chloride 101 - 111 mmol/L 106  CO2 22 - 32 mmol/L 28  Calcium 8.9 - 10.3 mg/dL 9.5  Total Protein 6.5 - 8.1 g/dL 7.7  Total Bilirubin 0.3 - 1.2 mg/dL 1.3(H)  Alkaline Phos 38 - 126 U/L 54  AST 15 - 41 U/L 41  ALT 17 - 63 U/L 65(H)   CBC Latest Ref Rng & Units 08/25/2016  WBC 3.8 - 10.6 K/uL 6.0  Hemoglobin 13.0 - 18.0 g/dL 17.3  Hematocrit 40.0 - 52.0 % 48.3  Platelets 150 - 440 K/uL 198      Assessment and plan- Patient is a 51 y.o. male who sees [place] for following issues  1. Seminoma likely Stage 1 as he had 1 dose of carboplatin folllowing surgery. This was in 2006 and since it is 12 years since diagnosis, he does not need any more blood work or imaging for surveillance at this time.   2. Secondary polycythemia from testosterone use- last phlebotomy was about 2 years ago. Today his hct is <50. No phlebotomy today. We will get hct checked Q3 months with urology and we should be notified if it is >50 for phlebotomy. Otherwise I will see him back in 1 yr with cbc.      Visit Diagnosis 1. Polycythemia,  secondary   2. History of testicular cancer      Dr. Randa Evens, MD, MPH [facility] at [facility] Pager- [phone] 08/25/2016 9:23 AM

## 2016-08-26 LAB — AFP TUMOR MARKER: AFP-Tumor Marker: 2.1 ng/mL (ref 0.0–8.3)

## 2016-08-26 LAB — BETA HCG QUANT (REF LAB): Beta hCG, Tumor Marker: <1 m[IU]/mL (ref 0–3)

## 2016-09-02 ENCOUNTER — Other Ambulatory Visit: Payer: Self-pay

## 2016-09-02 DIAGNOSIS — E291 Testicular hypofunction: Secondary | ICD-10-CM

## 2016-09-03 ENCOUNTER — Other Ambulatory Visit: Payer: 59

## 2016-09-03 DIAGNOSIS — E291 Testicular hypofunction: Secondary | ICD-10-CM | POA: Diagnosis not present

## 2016-09-04 ENCOUNTER — Telehealth: Payer: Self-pay

## 2016-09-04 LAB — TESTOSTERONE: Testosterone: 414 ng/dL (ref 264–916)

## 2016-09-04 LAB — HEMATOCRIT: Hematocrit: 49.2 % (ref 37.5–51.0)

## 2016-09-04 NOTE — Telephone Encounter (Signed)
-----   Message from Nori Riis, PA-C sent at 09/04/2016  7:56 AM EDT ----- Please let [place] Madden know that his labs are good.  I will need to see him in 3 months for ADAM, SHIM, I PSS and exam.  He will need testosterone HBG, HCT, PSA to be drawn before appointment

## 2016-09-04 NOTE — Telephone Encounter (Signed)
Called patient. Left detailed message per DPR.

## 2017-01-13 ENCOUNTER — Emergency Department: Payer: 59 | Admitting: Anesthesiology

## 2017-01-13 ENCOUNTER — Emergency Department: Payer: 59

## 2017-01-13 ENCOUNTER — Encounter
Admission: EM | Disposition: A | Discharge status: Home / Self Care | Payer: Self-pay | Source: Home / Self Care | Attending: General Surgery

## 2017-01-13 ENCOUNTER — Encounter: Payer: Self-pay | Admitting: Emergency Medicine

## 2017-01-13 ENCOUNTER — Inpatient Hospital Stay
Admission: EM | Admit: 2017-01-13 | Discharge: 2017-01-17 | DRG: 339 | Disposition: A | Discharge status: Home / Self Care | Payer: 59 | Attending: Surgery | Admitting: Surgery

## 2017-01-13 DIAGNOSIS — E291 Testicular hypofunction: Secondary | ICD-10-CM | POA: Diagnosis present

## 2017-01-13 DIAGNOSIS — R1031 Right lower quadrant pain: Secondary | ICD-10-CM | POA: Diagnosis not present

## 2017-01-13 DIAGNOSIS — M109 Gout, unspecified: Secondary | ICD-10-CM | POA: Diagnosis present

## 2017-01-13 DIAGNOSIS — K3533 Acute appendicitis with perforation and localized peritonitis, with abscess: Secondary | ICD-10-CM | POA: Diagnosis not present

## 2017-01-13 DIAGNOSIS — K3532 Acute appendicitis with perforation and localized peritonitis, without abscess: Secondary | ICD-10-CM | POA: Diagnosis present

## 2017-01-13 DIAGNOSIS — Z6836 Body mass index (BMI) 36.0-36.9, adult: Secondary | ICD-10-CM

## 2017-01-13 DIAGNOSIS — R110 Nausea: Secondary | ICD-10-CM | POA: Diagnosis not present

## 2017-01-13 DIAGNOSIS — K3580 Unspecified acute appendicitis: Secondary | ICD-10-CM | POA: Diagnosis not present

## 2017-01-13 DIAGNOSIS — K567 Ileus, unspecified: Secondary | ICD-10-CM | POA: Diagnosis not present

## 2017-01-13 DIAGNOSIS — Z791 Long term (current) use of non-steroidal anti-inflammatories (NSAID): Secondary | ICD-10-CM

## 2017-01-13 DIAGNOSIS — N400 Enlarged prostate without lower urinary tract symptoms: Secondary | ICD-10-CM | POA: Diagnosis present

## 2017-01-13 DIAGNOSIS — K3589 Other acute appendicitis without perforation or gangrene: Secondary | ICD-10-CM

## 2017-01-13 DIAGNOSIS — R109 Unspecified abdominal pain: Secondary | ICD-10-CM | POA: Diagnosis not present

## 2017-01-13 DIAGNOSIS — E669 Obesity, unspecified: Secondary | ICD-10-CM | POA: Diagnosis present

## 2017-01-13 DIAGNOSIS — Z8547 Personal history of malignant neoplasm of testis: Secondary | ICD-10-CM

## 2017-01-13 DIAGNOSIS — Z79899 Other long term (current) drug therapy: Secondary | ICD-10-CM

## 2017-01-13 HISTORY — PX: LAPAROSCOPIC APPENDECTOMY: SHX408

## 2017-01-13 LAB — URINALYSIS, COMPLETE (UACMP) WITH MICROSCOPIC
Bacteria, UA: NONE SEEN
Bilirubin Urine: NEGATIVE
GLUCOSE, UA: NEGATIVE mg/dL
Hgb urine dipstick: NEGATIVE
KETONES UR: NEGATIVE mg/dL
Leukocytes, UA: NEGATIVE
NITRITE: NEGATIVE
PH: 6 (ref 5.0–8.0)
PROTEIN: NEGATIVE mg/dL
SPECIFIC GRAVITY, URINE: 1.017 (ref 1.005–1.030)
Squamous Epithelial / LPF: NONE SEEN

## 2017-01-13 LAB — COMPREHENSIVE METABOLIC PANEL
ALBUMIN: 3.7 g/dL (ref 3.5–5.0)
ALK PHOS: 49 U/L (ref 38–126)
ALT: 36 U/L (ref 17–63)
ANION GAP: 10 (ref 5–15)
AST: 26 U/L (ref 15–41)
BILIRUBIN TOTAL: 1.1 mg/dL (ref 0.3–1.2)
BUN: 16 mg/dL (ref 6–20)
CALCIUM: 8.6 mg/dL — AB (ref 8.9–10.3)
CO2: 25 mmol/L (ref 22–32)
Chloride: 102 mmol/L (ref 101–111)
Creatinine, Ser: 1.01 mg/dL (ref 0.61–1.24)
GFR calc Af Amer: >60 mL/min (ref >60)
GFR calc non Af Amer: >60 mL/min (ref >60)
GLUCOSE: 86 mg/dL (ref 65–99)
POTASSIUM: 3.3 mmol/L — AB (ref 3.5–5.1)
SODIUM: 137 mmol/L (ref 135–145)
TOTAL PROTEIN: 7.1 g/dL (ref 6.5–8.1)

## 2017-01-13 LAB — CBC
HEMATOCRIT: 40.2 % (ref 40.0–52.0)
HEMOGLOBIN: 13.9 g/dL (ref 13.0–18.0)
MCH: 32 pg (ref 26.0–34.0)
MCHC: 34.6 g/dL (ref 32.0–36.0)
MCV: 92.5 fL (ref 80.0–100.0)
Platelets: 159 10*3/uL (ref 150–440)
RBC: 4.35 MIL/uL — ABNORMAL LOW (ref 4.40–5.90)
RDW: 13 % (ref 11.5–14.5)
WBC: 10.5 10*3/uL (ref 3.8–10.6)

## 2017-01-13 LAB — LIPASE, BLOOD: Lipase: 32 U/L (ref 11–51)

## 2017-01-13 SURGERY — APPENDECTOMY, LAPAROSCOPIC
Anesthesia: General | Site: Abdomen | Wound class: Contaminated

## 2017-01-13 MED ORDER — MIDAZOLAM HCL 2 MG/2ML IJ SOLN
INTRAMUSCULAR | Status: AC
  Filled 2017-01-13: qty 2

## 2017-01-13 MED ORDER — GLYCOPYRROLATE 0.2 MG/ML IJ SOLN
INTRAMUSCULAR | Status: DC | PRN
  Administered 2017-01-13: 0.2 mg via INTRAVENOUS

## 2017-01-13 MED ORDER — MIDAZOLAM HCL 2 MG/2ML IJ SOLN
INTRAMUSCULAR | Status: DC | PRN
  Administered 2017-01-13: 2 mg via INTRAVENOUS

## 2017-01-13 MED ORDER — FENTANYL CITRATE (PF) 100 MCG/2ML IJ SOLN
INTRAMUSCULAR | Status: DC | PRN
  Administered 2017-01-13: 100 ug via INTRAVENOUS
  Administered 2017-01-13 – 2017-01-14 (×5): 50 ug via INTRAVENOUS

## 2017-01-13 MED ORDER — LIDOCAINE HCL (PF) 1 % IJ SOLN
INTRAMUSCULAR | Status: AC
  Filled 2017-01-13: qty 30

## 2017-01-13 MED ORDER — SUCCINYLCHOLINE CHLORIDE 20 MG/ML IJ SOLN
INTRAMUSCULAR | Status: AC
  Filled 2017-01-13: qty 1

## 2017-01-13 MED ORDER — PIPERACILLIN-TAZOBACTAM 3.375 G IVPB 30 MIN
3.3750 g | Freq: Once | INTRAVENOUS | Status: AC
  Administered 2017-01-13: 3.375 g via INTRAVENOUS
  Filled 2017-01-13: qty 50

## 2017-01-13 MED ORDER — MORPHINE SULFATE (PF) 4 MG/ML IV SOLN: 4.0000 mg | INTRAVENOUS | Status: DC | PRN

## 2017-01-13 MED ORDER — SODIUM CHLORIDE 0.9 % IV BOLUS (SEPSIS)
1000.0000 mL | Freq: Once | INTRAVENOUS | Status: AC
  Administered 2017-01-13: 1000 mL via INTRAVENOUS

## 2017-01-13 MED ORDER — ONDANSETRON HCL 4 MG/2ML IJ SOLN
4.0000 mg | Freq: Once | INTRAMUSCULAR | Status: AC
  Administered 2017-01-13: 4 mg via INTRAVENOUS

## 2017-01-13 MED ORDER — DEXAMETHASONE SODIUM PHOSPHATE 10 MG/ML IJ SOLN
INTRAMUSCULAR | Status: DC | PRN
  Administered 2017-01-13: 10 mg via INTRAVENOUS

## 2017-01-13 MED ORDER — LACTATED RINGERS IV SOLN
INTRAVENOUS | Status: DC | PRN
  Administered 2017-01-13 – 2017-01-14 (×3): via INTRAVENOUS

## 2017-01-13 MED ORDER — MORPHINE SULFATE (PF) 4 MG/ML IV SOLN
INTRAVENOUS | Status: AC
  Filled 2017-01-13: qty 1

## 2017-01-13 MED ORDER — PROPOFOL 10 MG/ML IV BOLUS
INTRAVENOUS | Status: DC | PRN
  Administered 2017-01-13: 200 mg via INTRAVENOUS

## 2017-01-13 MED ORDER — PROPOFOL 10 MG/ML IV BOLUS
INTRAVENOUS | Status: AC
  Filled 2017-01-13: qty 20

## 2017-01-13 MED ORDER — IOPAMIDOL (ISOVUE-300) INJECTION 61%
100.0000 mL | Freq: Once | INTRAVENOUS | Status: AC | PRN
  Administered 2017-01-13: 100 mL via INTRAVENOUS
  Filled 2017-01-13: qty 100

## 2017-01-13 MED ORDER — ROCURONIUM BROMIDE 100 MG/10ML IV SOLN
INTRAVENOUS | Status: DC | PRN
  Administered 2017-01-13: 20 mg via INTRAVENOUS
  Administered 2017-01-13: 30 mg via INTRAVENOUS

## 2017-01-13 MED ORDER — LIDOCAINE HCL (PF) 2 % IJ SOLN
INTRAMUSCULAR | Status: AC
  Filled 2017-01-13: qty 10

## 2017-01-13 MED ORDER — FENTANYL CITRATE (PF) 250 MCG/5ML IJ SOLN
INTRAMUSCULAR | Status: AC
  Filled 2017-01-13: qty 5

## 2017-01-13 MED ORDER — ONDANSETRON HCL 4 MG/2ML IJ SOLN
INTRAMUSCULAR | Status: AC
  Administered 2017-01-13: 4 mg via INTRAVENOUS
  Filled 2017-01-13: qty 2

## 2017-01-13 MED ORDER — SUGAMMADEX SODIUM 200 MG/2ML IV SOLN
INTRAVENOUS | Status: AC
  Filled 2017-01-13: qty 2

## 2017-01-13 MED ORDER — BUPIVACAINE-EPINEPHRINE (PF) 0.25% -1:200000 IJ SOLN
INTRAMUSCULAR | Status: AC
  Filled 2017-01-13: qty 30

## 2017-01-13 MED ORDER — SUCCINYLCHOLINE CHLORIDE 20 MG/ML IJ SOLN
INTRAMUSCULAR | Status: DC | PRN
  Administered 2017-01-13: 120 mg via INTRAVENOUS

## 2017-01-13 SURGICAL SUPPLY — 48 items
ADH LQ OCL WTPRF AMP STRL LF (MISCELLANEOUS) ×1
ADHESIVE MASTISOL STRL (MISCELLANEOUS) ×3 IMPLANT
APPLIER CLIP 5 13 M/L LIGAMAX5 (MISCELLANEOUS)
APR CLP MED LRG 5 ANG JAW (MISCELLANEOUS)
BAG SPEC RTRVL LRG 6X4 10 (ENDOMECHANICALS) ×1
BLADE SURG SZ11 CARB STEEL (BLADE) ×3 IMPLANT
BULB RESERV EVAC DRAIN JP 100C (MISCELLANEOUS) ×2 IMPLANT
CANISTER SUCT 1200ML W/VALVE (MISCELLANEOUS) ×3 IMPLANT
CHLORAPREP W/TINT 26ML (MISCELLANEOUS) ×3 IMPLANT
CLIP APPLIE 5 13 M/L LIGAMAX5 (MISCELLANEOUS) IMPLANT
CLOSURE WOUND 1/2 X4 (GAUZE/BANDAGES/DRESSINGS) ×1
CUTTER FLEX LINEAR 45M (STAPLE) ×3 IMPLANT
DRAIN CHANNEL JP 19F (MISCELLANEOUS) ×2 IMPLANT
DRSG TEGADERM 2-3/8X2-3/4 SM (GAUZE/BANDAGES/DRESSINGS) ×9 IMPLANT
DRSG TELFA 4X3 1S NADH ST (GAUZE/BANDAGES/DRESSINGS) ×3 IMPLANT
ELECT REM PT RETURN 9FT ADLT (ELECTROSURGICAL) ×3
ELECTRODE REM PT RTRN 9FT ADLT (ELECTROSURGICAL) ×1 IMPLANT
GLOVE BIO SURGEON STRL SZ7.5 (GLOVE) ×7 IMPLANT
GLOVE INDICATOR 8.0 STRL GRN (GLOVE) ×5 IMPLANT
GOWN STRL REUS W/ TWL LRG LVL3 (GOWN DISPOSABLE) ×2 IMPLANT
GOWN STRL REUS W/TWL LRG LVL3 (GOWN DISPOSABLE) ×6
IRRIGATION STRYKERFLOW (MISCELLANEOUS) IMPLANT
IRRIGATOR STRYKERFLOW (MISCELLANEOUS) ×3
IV NS 1000ML (IV SOLUTION) ×3
IV NS 1000ML BAXH (IV SOLUTION) ×1 IMPLANT
KIT RM TURNOVER STRD PROC AR (KITS) ×3 IMPLANT
LABEL OR SOLS (LABEL) ×3 IMPLANT
NDL HYPO 25X1 1.5 SAFETY (NEEDLE) ×1 IMPLANT
NEEDLE HYPO 25X1 1.5 SAFETY (NEEDLE) ×3 IMPLANT
NEEDLE VERESS 14GA 120MM (NEEDLE) ×3 IMPLANT
NS IRRIG 500ML POUR BTL (IV SOLUTION) ×3 IMPLANT
PACK LAP CHOLECYSTECTOMY (MISCELLANEOUS) ×3 IMPLANT
POUCH SPECIMEN RETRIEVAL 10MM (ENDOMECHANICALS) ×3 IMPLANT
RELOAD 45 VASCULAR/THIN (ENDOMECHANICALS) ×3 IMPLANT
RELOAD STAPLE 45 2.5 WHT GRN (ENDOMECHANICALS) ×1 IMPLANT
RELOAD STAPLE 45 3.5 BLU ETS (ENDOMECHANICALS) ×1 IMPLANT
RELOAD STAPLE TA45 3.5 REG BLU (ENDOMECHANICALS) ×3 IMPLANT
SCALPEL HARMONIC ACE (MISCELLANEOUS) ×3 IMPLANT
SLEEVE ENDOPATH XCEL 5M (ENDOMECHANICALS) ×5 IMPLANT
STRIP CLOSURE SKIN 1/2X4 (GAUZE/BANDAGES/DRESSINGS) ×2 IMPLANT
SUT MNCRL 4-0 (SUTURE) ×3
SUT MNCRL 4-0 27XMFL (SUTURE) ×1
SUT VICRYL 0 UR6 27IN ABS (SUTURE) IMPLANT
SUTURE MNCRL 4-0 27XMF (SUTURE) ×1 IMPLANT
TRAY FOLEY W/METER SILVER 16FR (SET/KITS/TRAYS/PACK) ×3 IMPLANT
TROCAR XCEL 12X100 BLDLESS (ENDOMECHANICALS) ×3 IMPLANT
TROCAR XCEL NON-BLD 5MMX100MML (ENDOMECHANICALS) ×3 IMPLANT
TUBING INSUFFLATOR HI FLOW (MISCELLANEOUS) ×3 IMPLANT

## 2017-01-13 NOTE — ED Notes (Signed)
FIRST NURSE NOTE:  Pt sent here by PCP for RLQ and RUQ abdominal pain that started 5 days ago. Pt ambulatory in lobby. Pt has paperwork from provider's office.

## 2017-01-13 NOTE — Anesthesia Procedure Notes (Signed)
Performed by: Orville Widmann       

## 2017-01-13 NOTE — ED Notes (Signed)
Attemped IV access x 1, able to obtain blood samples but not able to use as an IV

## 2017-01-13 NOTE — Anesthesia Preprocedure Evaluation (Signed)
Anesthesia Evaluation  Patient identified by MRN, date of birth, ID band Patient awake    Reviewed: Allergy & Precautions, NPO status , Patient's Chart, lab work & pertinent test results  History of Anesthesia Complications Negative for: history of anesthetic complications  Airway Mallampati: III  TM Distance: >3 FB Neck ROM: Full    Dental no notable dental hx.    Pulmonary neg pulmonary ROS, neg sleep apnea, neg COPD,    breath sounds clear to auscultation- rhonchi (-) wheezing      Cardiovascular Exercise Tolerance: Good (-) hypertension(-) CAD and (-) Past MI  Rhythm:Regular Rate:Normal - Systolic murmurs and - Diastolic murmurs    Neuro/Psych negative neurological ROS  negative psych ROS   GI/Hepatic negative GI ROS, Neg liver ROS,   Endo/Other  negative endocrine ROSneg diabetes  Renal/GU negative Renal ROS     Musculoskeletal negative musculoskeletal ROS (+)   Abdominal (+) + obese,   Peds  Hematology negative hematology ROS (+)   Anesthesia Other Findings Past Medical History: No date: BPH (benign prostatic hyperplasia) No date: Elevated hemoglobin ([facility]) No date: Gout No date: History of testicular cancer No date: Hypogonadism in male No date: Over weight   Reproductive/Obstetrics                             Anesthesia Physical Anesthesia Plan  ASA: II and emergent  Anesthesia Plan: General   Post-op Pain Management:    Induction: Intravenous, Rapid sequence and Cricoid pressure planned  PONV Risk Score and Plan: 1 and Ondansetron and Dexamethasone  Airway Management Planned: Oral ETT  Additional Equipment:   Intra-op Plan:   Post-operative Plan: Extubation in OR  Informed Consent: I have reviewed the patients History and Physical, chart, labs and discussed the procedure including the risks, benefits and alternatives for the proposed anesthesia with the patient  or authorized representative who has indicated his/her understanding and acceptance.   Dental advisory given  Plan Discussed with: CRNA and Anesthesiologist  Anesthesia Plan Comments:         Anesthesia Quick Evaluation

## 2017-01-13 NOTE — ED Notes (Signed)
ED Provider at bedside. 

## 2017-01-13 NOTE — ED Triage Notes (Signed)
Pt from provider's office c/o RUQ abdominal pain. Pt also had RLQ at office upon palpation. Pt has hx of lap chole, but still has appendix.  Pt c/o nausea and decreased appetite. No vomiting. Pt states he feels swollen in his abdomen.

## 2017-01-13 NOTE — ED Notes (Signed)
Blood tubes hand carried and delivered to lab by this nurse

## 2017-01-13 NOTE — ED Notes (Signed)
Redrew cbc and cmp and sent to lab

## 2017-01-13 NOTE — Anesthesia Procedure Notes (Signed)
Procedure Name: Intubation Date/Time: 01/13/2017 11:06 PM Performed by: Lendon Colonel Pre-anesthesia Checklist: Patient identified, Patient being monitored, Timeout performed, Emergency Drugs available and Suction available Patient Re-evaluated:Patient Re-evaluated prior to induction Oxygen Delivery Method: Circle system utilized Preoxygenation: Pre-oxygenation with 100% oxygen Induction Type: IV induction Ventilation: Mask ventilation without difficulty Laryngoscope Size: Miller and 2 Grade View: Grade II Tube type: Oral Tube size: 7.0 mm Number of attempts: 1 Airway Equipment and Method: Stylet Placement Confirmation: ETT inserted through vocal cords under direct vision,  positive ETCO2 and breath sounds checked- equal and bilateral Secured at: 21 cm Tube secured with: Tape Dental Injury: Teeth and Oropharynx as per pre-operative assessment

## 2017-01-13 NOTE — H&P (Signed)
Patient ID: [name], male   DOB: 01-31-1966, 51 y.o.   MRN: [id number]  CC: Abdominal pain  HPI [place] Madden is a 51 y.o. male who presents to the emergency department for evaluation of a 5 day history of abdominal pain. Patient reports that the pain has progressively worsened to where he came to the emergency department today. He states he had nausea and chills on Friday but then it went away taking him think that he had a stomach bug. He denies any fevers, chest pain, shortness of breath, vomiting, diarrhea, constipation. Patient states the pain is crampy and aching is always in the right lower quadrant. He's never had anything like this before and is otherwise in his usual state of health.  HPI  Past Medical History:  Diagnosis Date  . BPH (benign prostatic hyperplasia)   . Elevated hemoglobin ([place])   . Gout   . History of testicular cancer   . Hypogonadism in male   . Over weight     Past Surgical History:  Procedure Laterality Date  . CHOLECYSTECTOMY    . GALLBLADDER SURGERY    . orchiectomy unilateral      Family History  Problem Relation Age of Onset  . Heart disease Unknown   . Prostate cancer Neg Hx   . Bladder Cancer Neg Hx   . Kidney disease Neg Hx   . Kidney cancer Neg Hx     Social History Social History  Substance Use Topics  . Smoking status: Never Smoker  . Smokeless tobacco: Never Used  . Alcohol use 0.0 oz/week     Comment: occasional    No Known Allergies  Current Facility-Administered Medications  Medication Dose Route Frequency Provider Last Rate Last Dose  . morphine 4 MG/ML injection 4 mg  4 mg Intravenous Q3H PRN Merlyn Lot, MD      . piperacillin-tazobactam (ZOSYN) IVPB 3.375 g  3.375 g Intravenous Once Merlyn Lot, MD 100 mL/hr at 01/13/17 1949 3.375 g at 01/13/17 1949  . Testosterone PLLT 75 mg  75 mg Implant Once McGowan, Shannon A, PA-C      . Testosterone PLLT 75 mg  75 mg Implant Once Nori Riis, PA-C        Current Outpatient Prescriptions  Medication Sig Dispense Refill  . allopurinol (ZYLOPRIM) 300 MG tablet     . aspirin-acetaminophen-caffeine (EXCEDRIN MIGRAINE) 250-250-65 MG tablet Take 1 tablet by mouth every 6 (six) hours as needed for headache.    . indomethacin (INDOCIN) 50 MG capsule Take 50 mg by mouth 2 (two) times daily with a meal.    . tadalafil (CIALIS) 20 MG tablet Take 1 tablet (20 mg total) by mouth daily as needed for erectile dysfunction. (Patient not taking: Reported on 02/06/2016) 6 tablet 12     Review of Systems A Multi-point review of systems was asked and was negative except for the findings documented in the history of present illness  Physical Exam Blood pressure 126/81, pulse 72, temperature 98.6 F (37 C), temperature source Oral, resp. rate 18, height 5\' 8"  (1.727 m), weight 105.7 kg (233 lb), SpO2 100 %. CONSTITUTIONAL: No acute distress. EYES: Pupils are equal, round, and reactive to light, Sclera are non-icteric. EARS, NOSE, MOUTH AND THROAT: The oropharynx is clear. The oral mucosa is pink and moist. Hearing is intact to voice. LYMPH NODES:  Lymph nodes in the neck are normal. RESPIRATORY:  Lungs are clear. There is normal respiratory effort, with equal breath sounds bilaterally, and  without pathologic use of accessory muscles. CARDIOVASCULAR: Heart is regular without murmurs, gallops, or rubs. GI: The abdomen is soft, tender to palpation in the right lower quadrant McBurney's point with a negative Rovsing, psoas, heeltap, and nondistended. There are no palpable masses. There is no hepatosplenomegaly. There are normal bowel sounds in all quadrants. GU: Rectal deferred.   MUSCULOSKELETAL: Normal muscle strength and tone. No cyanosis or edema.   SKIN: Turgor is good and there are no pathologic skin lesions or ulcers. NEUROLOGIC: Motor and sensation is grossly normal. Cranial nerves are grossly intact. PSYCH:  Oriented to person, place and time. Affect is  normal.  Data Reviewed Images and labs reviewed. Labs show mild hypokalemia at 3.3 but are otherwise within normal limits including a normal white blood cell count of 10.5. CT of the abdomen shows a dilated, thickened appendix with surrounding inflammation as well as what appears to be a small contained periappendiceal abscess. There is no evidence of free air or free fluid. I have personally reviewed the patient's imaging, laboratory findings and medical records.    Assessment    Acute appendicitis    Plan    51 year old male with acute appendicitis. Discussed the diagnosis in detail the patient to include the treatment options of surgery versus IV antibiotics. The risks, benefits, alternatives of both were described in detail. After this conversation the patient elects to proceed to surgery for laparoscopic appendectomy. The surgery was described in detail he and he voiced understanding. Plan to proceed to the operating room as soon as the OR schedule allows. He understands that he will stay in the hospital overnight for 24 hours of antibiotics at least. All questions answered to the patient's satisfaction.     Time spent with the patient was 50 minutes, with more than 50% of the time spent in face-to-face education, counseling and care coordination.     Clayburn Pert, MD FACS General Surgeon 01/13/2017, 8:02 PM

## 2017-01-13 NOTE — ED Provider Notes (Signed)
[facility] Emergency Department Provider Note    First MD Initiated Contact with Patient 01/13/17 1627     (approximate)  I have reviewed the triage vital signs and the nursing notes.   HISTORY  Chief Complaint Abdominal Pain    HPI Tyrone Madden is a 51 y.o. male his chief complaint of roughly 5 days of progressively worsening generalized abdominal pain radiating to the right lower quadrant over the past day. Has felt nauseated with decreased appetite but no vomiting. Has had chills this morning but no measured fevers. Was initially seen in his PCPs office today and sent to the ER for further evaluation. Patient describes the pain as a crampy aching sensation that is unrelenting. He is status post cholecystectomy but still has appendix.   Past Medical History:  Diagnosis Date  . BPH (benign prostatic hyperplasia)   . Elevated hemoglobin ([place])   . Gout   . History of testicular cancer   . Hypogonadism in male   . Over weight    Family History  Problem Relation Age of Onset  . Heart disease Unknown   . Prostate cancer Neg Hx   . Bladder Cancer Neg Hx   . Kidney disease Neg Hx   . Kidney cancer Neg Hx    Past Surgical History:  Procedure Laterality Date  . CHOLECYSTECTOMY    . GALLBLADDER SURGERY    . orchiectomy unilateral     Patient Active Problem List   Diagnosis Date Noted  . Appendicitis, acute   . Blood glucose elevated 05/30/2015  . Gout 05/30/2015  . Decreased testosterone level 05/30/2015  . Pure hypercholesterolemia 05/30/2015  . Elevated alanine aminotransferase (ALT) level 03/26/2015  . Hypogonadism in male 11/06/2014  . BPH (benign prostatic hyperplasia) 11/06/2014  . History of testicular cancer 11/06/2014      Prior to Admission medications   Medication Sig Start Date End Date Taking? Authorizing Provider  allopurinol (ZYLOPRIM) 300 MG tablet  10/01/14  Yes [provider]  aspirin-acetaminophen-caffeine  (EXCEDRIN MIGRAINE) 250-250-65 MG tablet Take 1 tablet by mouth every 6 (six) hours as needed for headache.    [provider]  indomethacin (INDOCIN) 50 MG capsule Take 50 mg by mouth 2 (two) times daily with a meal.    [provider]  tadalafil (CIALIS) 20 MG tablet Take 1 tablet (20 mg total) by mouth daily as needed for erectile dysfunction. Patient not taking: Reported on 02/06/2016 08/08/15   Zara Council A, PA-C    Allergies Patient has no known allergies.    Social History Social History  Substance Use Topics  . Smoking status: Never Smoker  . Smokeless tobacco: Never Used  . Alcohol use 0.0 oz/week     Comment: occasional    Review of Systems Patient denies headaches, rhinorrhea, blurry vision, numbness, shortness of breath, chest pain, edema, cough, abdominal pain, nausea, vomiting, diarrhea, dysuria, fevers, rashes or hallucinations unless otherwise stated above in HPI. ____________________________________________   PHYSICAL EXAM:  VITAL SIGNS: Vitals:   01/13/17 1503  BP: 126/81  Pulse: 72  Resp: 18  Temp: 98.6 F (37 C)  SpO2: 100%    Constitutional: Alert and oriented. Well appearing and in no acute distress. Eyes: Conjunctivae are normal.  Head: Atraumatic. Nose: No congestion/rhinnorhea. Mouth/Throat: Mucous membranes are moist.   Neck: No stridor. Painless ROM.  Cardiovascular: Normal rate, regular rhythm. Grossly normal heart sounds.  Good peripheral circulation. Respiratory: Normal respiratory effort.  No retractions. Lungs CTAB. Gastrointestinal:  Soft with +++ RLQ ttp and guarding. No distention. No abdominal bruits. No CVA tenderness. Musculoskeletal: No lower extremity tenderness nor edema.  No joint effusions. Neurologic:  Normal speech and language. No gross focal neurologic deficits are appreciated. No facial droop Skin:  Skin is warm, dry and intact. No rash noted. Psychiatric: Mood and affect are normal. Speech and  behavior are normal.  ____________________________________________   LABS (all labs ordered are listed, but only abnormal results are displayed)  Results for orders placed or performed during the hospital encounter of 01/13/17 (from the past 24 hour(s))  Urinalysis, Complete w Microscopic     Status: Abnormal   Collection Time: 01/13/17  3:08 PM  Result Value Ref Range   Color, Urine YELLOW (A) YELLOW   APPearance CLEAR (A) CLEAR   Specific Gravity, Urine 1.017 1.005 - 1.030   pH 6.0 5.0 - 8.0   Glucose, UA NEGATIVE NEGATIVE mg/dL   Hgb urine dipstick NEGATIVE NEGATIVE   Bilirubin Urine NEGATIVE NEGATIVE   Ketones, ur NEGATIVE NEGATIVE mg/dL   Protein, ur NEGATIVE NEGATIVE mg/dL   Nitrite NEGATIVE NEGATIVE   Leukocytes, UA NEGATIVE NEGATIVE   RBC / HPF 0-5 0 - 5 RBC/hpf   WBC, UA 0-5 0 - 5 WBC/hpf   Bacteria, UA NONE SEEN NONE SEEN   Squamous Epithelial / LPF NONE SEEN NONE SEEN   Mucus PRESENT   CBC     Status: Abnormal   Collection Time: 01/13/17  6:46 PM  Result Value Ref Range   WBC 10.5 3.8 - 10.6 K/uL   RBC 4.35 (L) 4.40 - 5.90 MIL/uL   Hemoglobin 13.9 13.0 - 18.0 g/dL   HCT 40.2 40.0 - 52.0 %   MCV 92.5 80.0 - 100.0 fL   MCH 32.0 26.0 - 34.0 pg   MCHC 34.6 32.0 - 36.0 g/dL   RDW 13.0 11.5 - 14.5 %   Platelets 159 150 - 440 K/uL  Comprehensive metabolic panel     Status: Abnormal   Collection Time: 01/13/17  6:46 PM  Result Value Ref Range   Sodium 137 135 - 145 mmol/L   Potassium 3.3 (L) 3.5 - 5.1 mmol/L   Chloride 102 101 - 111 mmol/L   CO2 25 22 - 32 mmol/L   Glucose, Bld 86 65 - 99 mg/dL   BUN 16 6 - 20 mg/dL   Creatinine, Ser 1.01 0.61 - 1.24 mg/dL   Calcium 8.6 (L) 8.9 - 10.3 mg/dL   Total Protein 7.1 6.5 - 8.1 g/dL   Albumin 3.7 3.5 - 5.0 g/dL   AST 26 15 - 41 U/L   ALT 36 17 - 63 U/L   Alkaline Phosphatase 49 38 - 126 U/L   Total Bilirubin 1.1 0.3 - 1.2 mg/dL   GFR calc non Af Amer >60 >60 mL/min   GFR calc Af Amer >60 >60 mL/min   Anion gap 10  5 - 15  Lipase, blood     Status: None   Collection Time: 01/13/17  6:46 PM  Result Value Ref Range   Lipase 32 11 - 51 U/L   ____________________________________________ ____________________________________________  RADIOLOGY  I personally reviewed all radiographic images ordered to evaluate for the above acute complaints and reviewed radiology reports and findings.  These findings were personally discussed with the patient.  Please see medical record for radiology report.  ____________________________________________   PROCEDURES  Procedure(s) performed:  Procedures    Critical Care performed: no ____________________________________________   INITIAL IMPRESSION /  ASSESSMENT AND PLAN / ED COURSE  Pertinent labs & imaging results that were available during my care of the patient were reviewed by me and considered in my medical decision making (see chart for details).  DDX: appy, colitis, diverticulitis, perforation,   Jonathin Heinicke is a 51 y.o. who presents to the ED with right lower quadrant abdominal pain as described above. He is currently afebrile but describes chills earlier concerning for fever. Blood work sent for the above differential is fairly reassuring. Patient given IV fluids. I will order CT imaging due to concern for the above differential.  Clinical Course as of Jan 14 2119  Wed Jan 13, 2017  1929 patient does have evidence of acute appendicitis probable abscess formation. Remains hemodynamic stable but will continue IV fluids as well as IV antibiotics. I spoke with Dr. Adonis Huguenin of general surgery who kindly agrees to admit patient for further evaluation and management.  [PR]    Clinical Course User Index [PR] Merlyn Lot, MD     ____________________________________________   FINAL CLINICAL IMPRESSION(S) / ED DIAGNOSES  Final diagnoses:  Other acute appendicitis      NEW MEDICATIONS STARTED DURING THIS VISIT:  New Prescriptions   No  medications on file     Note:  This document was prepared using Dragon voice recognition software and may include unintentional dictation errors.    Merlyn Lot, MD 01/13/17 2120

## 2017-01-13 NOTE — ED Notes (Signed)
Surgical Provider at bedside. 

## 2017-01-14 ENCOUNTER — Encounter: Payer: Self-pay | Admitting: General Surgery

## 2017-01-14 DIAGNOSIS — M109 Gout, unspecified: Secondary | ICD-10-CM | POA: Diagnosis present

## 2017-01-14 DIAGNOSIS — K3532 Acute appendicitis with perforation and localized peritonitis, without abscess: Secondary | ICD-10-CM

## 2017-01-14 DIAGNOSIS — E291 Testicular hypofunction: Secondary | ICD-10-CM | POA: Diagnosis present

## 2017-01-14 DIAGNOSIS — N400 Enlarged prostate without lower urinary tract symptoms: Secondary | ICD-10-CM | POA: Diagnosis present

## 2017-01-14 DIAGNOSIS — K3533 Acute appendicitis with perforation and localized peritonitis, with abscess: Secondary | ICD-10-CM | POA: Diagnosis not present

## 2017-01-14 DIAGNOSIS — Z79899 Other long term (current) drug therapy: Secondary | ICD-10-CM | POA: Diagnosis not present

## 2017-01-14 DIAGNOSIS — K567 Ileus, unspecified: Secondary | ICD-10-CM | POA: Diagnosis not present

## 2017-01-14 DIAGNOSIS — K3589 Other acute appendicitis without perforation or gangrene: Secondary | ICD-10-CM | POA: Diagnosis not present

## 2017-01-14 DIAGNOSIS — Z8547 Personal history of malignant neoplasm of testis: Secondary | ICD-10-CM | POA: Diagnosis not present

## 2017-01-14 DIAGNOSIS — Z791 Long term (current) use of non-steroidal anti-inflammatories (NSAID): Secondary | ICD-10-CM | POA: Diagnosis not present

## 2017-01-14 DIAGNOSIS — K3580 Unspecified acute appendicitis: Secondary | ICD-10-CM | POA: Diagnosis not present

## 2017-01-14 DIAGNOSIS — E669 Obesity, unspecified: Secondary | ICD-10-CM | POA: Diagnosis present

## 2017-01-14 DIAGNOSIS — R1031 Right lower quadrant pain: Secondary | ICD-10-CM | POA: Diagnosis not present

## 2017-01-14 DIAGNOSIS — Z6836 Body mass index (BMI) 36.0-36.9, adult: Secondary | ICD-10-CM | POA: Diagnosis not present

## 2017-01-14 HISTORY — DX: Acute appendicitis with perforation, localized peritonitis, and gangrene, without abscess: K35.32

## 2017-01-14 LAB — CBC
HCT: 40.5 % (ref 40.0–52.0)
Hemoglobin: 14.4 g/dL (ref 13.0–18.0)
MCH: 32 pg (ref 26.0–34.0)
MCHC: 35.4 g/dL (ref 32.0–36.0)
MCV: 90.3 fL (ref 80.0–100.0)
PLATELETS: 218 10*3/uL (ref 150–440)
RBC: 4.49 MIL/uL (ref 4.40–5.90)
RDW: 12.9 % (ref 11.5–14.5)
WBC: 13.5 10*3/uL — ABNORMAL HIGH (ref 3.8–10.6)

## 2017-01-14 LAB — BASIC METABOLIC PANEL
Anion gap: 7 (ref 5–15)
BUN: 14 mg/dL (ref 6–20)
CALCIUM: 8.9 mg/dL (ref 8.9–10.3)
CO2: 27 mmol/L (ref 22–32)
CREATININE: 1.06 mg/dL (ref 0.61–1.24)
Chloride: 103 mmol/L (ref 101–111)
GFR calc Af Amer: >60 mL/min (ref >60)
Glucose, Bld: 140 mg/dL — ABNORMAL HIGH (ref 65–99)
Potassium: 4 mmol/L (ref 3.5–5.1)
SODIUM: 137 mmol/L (ref 135–145)

## 2017-01-14 LAB — POCT I-STAT CREATININE: CREATININE: 1 mg/dL (ref 0.61–1.24)

## 2017-01-14 MED ORDER — MORPHINE SULFATE (PF) 4 MG/ML IV SOLN
4.0000 mg | INTRAVENOUS | Status: DC | PRN
  Administered 2017-01-14 (×2): 4 mg via INTRAVENOUS
  Filled 2017-01-14 (×2): qty 1

## 2017-01-14 MED ORDER — SUGAMMADEX SODIUM 200 MG/2ML IV SOLN
INTRAVENOUS | Status: DC | PRN
  Administered 2017-01-14: 220 mg via INTRAVENOUS

## 2017-01-14 MED ORDER — LIDOCAINE HCL 1 % IJ SOLN
INTRAMUSCULAR | Status: DC | PRN
  Administered 2017-01-14: 10 mL via INTRADERMAL

## 2017-01-14 MED ORDER — FENTANYL CITRATE (PF) 100 MCG/2ML IJ SOLN
INTRAMUSCULAR | Status: AC
  Filled 2017-01-14: qty 2

## 2017-01-14 MED ORDER — FENTANYL CITRATE (PF) 100 MCG/2ML IJ SOLN
25.0000 ug | INTRAMUSCULAR | Status: DC | PRN
  Administered 2017-01-14 (×4): 25 ug via INTRAVENOUS

## 2017-01-14 MED ORDER — OXYCODONE-ACETAMINOPHEN 5-325 MG PO TABS
1.0000 | ORAL_TABLET | ORAL | Status: DC | PRN
  Administered 2017-01-14: 1 via ORAL
  Administered 2017-01-14 – 2017-01-16 (×9): 2 via ORAL
  Filled 2017-01-14 (×9): qty 2
  Filled 2017-01-14: qty 1

## 2017-01-14 MED ORDER — OXYCODONE HCL 5 MG PO TABS: 5.0000 mg | ORAL_TABLET | Freq: Once | ORAL | Status: DC | PRN

## 2017-01-14 MED ORDER — HYDROMORPHONE HCL 1 MG/ML IJ SOLN
1.0000 mg | INTRAMUSCULAR | Status: DC | PRN
  Administered 2017-01-14 (×3): 1 mg via INTRAVENOUS
  Filled 2017-01-14 (×3): qty 1

## 2017-01-14 MED ORDER — PIPERACILLIN-TAZOBACTAM 3.375 G IVPB
3.3750 g | Freq: Three times a day (TID) | INTRAVENOUS | Status: DC
  Administered 2017-01-14 – 2017-01-17 (×10): 3.375 g via INTRAVENOUS
  Filled 2017-01-14 (×10): qty 50

## 2017-01-14 MED ORDER — FENTANYL CITRATE (PF) 100 MCG/2ML IJ SOLN
INTRAMUSCULAR | Status: AC
  Administered 2017-01-14: 25 ug via INTRAVENOUS
  Filled 2017-01-14: qty 2

## 2017-01-14 MED ORDER — DIPHENHYDRAMINE HCL 25 MG PO CAPS: 25.0000 mg | ORAL_CAPSULE | Freq: Four times a day (QID) | ORAL | Status: DC | PRN

## 2017-01-14 MED ORDER — OXYCODONE HCL 5 MG/5ML PO SOLN: 5.0000 mg | Freq: Once | ORAL | Status: DC | PRN

## 2017-01-14 MED ORDER — PROCHLORPERAZINE MALEATE 10 MG PO TABS
10.0000 mg | ORAL_TABLET | Freq: Four times a day (QID) | ORAL | Status: DC | PRN
  Filled 2017-01-14: qty 1

## 2017-01-14 MED ORDER — DIPHENHYDRAMINE HCL 50 MG/ML IJ SOLN: 25.0000 mg | Freq: Four times a day (QID) | INTRAMUSCULAR | Status: DC | PRN

## 2017-01-14 MED ORDER — LACTATED RINGERS IV SOLN
INTRAVENOUS | Status: DC
  Administered 2017-01-14 – 2017-01-15 (×3): via INTRAVENOUS

## 2017-01-14 MED ORDER — PROMETHAZINE HCL 25 MG/ML IJ SOLN: 6.2500 mg | INTRAMUSCULAR | Status: DC | PRN

## 2017-01-14 MED ORDER — PANTOPRAZOLE SODIUM 40 MG IV SOLR
40.0000 mg | Freq: Every day | INTRAVENOUS | Status: DC
  Administered 2017-01-14 – 2017-01-15 (×3): 40 mg via INTRAVENOUS
  Filled 2017-01-14 (×3): qty 40

## 2017-01-14 MED ORDER — ONDANSETRON HCL 4 MG/2ML IJ SOLN
4.0000 mg | Freq: Four times a day (QID) | INTRAMUSCULAR | Status: DC | PRN
  Administered 2017-01-14: 4 mg via INTRAVENOUS
  Filled 2017-01-14: qty 2

## 2017-01-14 MED ORDER — BUPIVACAINE-EPINEPHRINE (PF) 0.25% -1:200000 IJ SOLN
INTRAMUSCULAR | Status: DC | PRN
  Administered 2017-01-14: 10 mL via PERINEURAL

## 2017-01-14 MED ORDER — PROCHLORPERAZINE EDISYLATE 5 MG/ML IJ SOLN
5.0000 mg | Freq: Four times a day (QID) | INTRAMUSCULAR | Status: DC | PRN
  Filled 2017-01-14: qty 2

## 2017-01-14 MED ORDER — HYDRALAZINE HCL 20 MG/ML IJ SOLN: 10.0000 mg | INTRAMUSCULAR | Status: DC | PRN

## 2017-01-14 MED ORDER — ENOXAPARIN SODIUM 40 MG/0.4ML ~~LOC~~ SOLN
40.0000 mg | SUBCUTANEOUS | Status: DC
  Administered 2017-01-15 – 2017-01-16 (×2): 40 mg via SUBCUTANEOUS
  Filled 2017-01-14 (×2): qty 0.4

## 2017-01-14 MED ORDER — ONDANSETRON 4 MG PO TBDP: 4.0000 mg | ORAL_TABLET | Freq: Four times a day (QID) | ORAL | Status: DC | PRN

## 2017-01-14 MED ORDER — MEPERIDINE HCL 50 MG/ML IJ SOLN: 6.2500 mg | INTRAMUSCULAR | Status: DC | PRN

## 2017-01-14 NOTE — Progress Notes (Signed)
1 Day Post-Op  Subjective: Crying out in considerable pain. Percocet has just been given. He has morphine ordered Q for. He did not want to take anything by mouth.  Patient is status post laparoscopy for ruptured appendix. Drain is in place  Objective: Vital signs in last 24 hours: Temp:  [97.6 F (36.4 C)-98.6 F (37 C)] 97.6 F (36.4 C) (10/11 0149) Pulse Rate:  [66-97] 87 (10/11 0149) Resp:  [14-18] 16 (10/11 0149) BP: (118-153)/(76-90) 124/76 (10/11 0149) SpO2:  [95 %-100 %] 95 % (10/11 0149) Weight:  [233 lb (105.7 kg)-245 lb 12.8 oz (111.5 kg)] 245 lb 12.8 oz (111.5 kg) (10/11 0149)    Intake/Output from previous day: 10/10 0701 - 10/11 0700 In: 1603 [P.O.:240; I.V.:1263; IV Piggyback:100] Out: 175 [Drains:155; Blood:20] Intake/Output this shift: No intake/output data recorded.  Physical exam:  Abdomen is soft and minimally tender drain in place with serous fluid only wounds are tender calves  Lab Results: CBC   Recent Labs  01/13/17 1846 01/14/17 0408  WBC 10.5 13.5*  HGB 13.9 14.4  HCT 40.2 40.5  PLT 159 218   BMET  Recent Labs  01/13/17 1846 01/14/17 0408  NA 137 137  K 3.3* 4.0  CL 102 103  CO2 25 27  GLUCOSE 86 140*  BUN 16 14  CREATININE 1.01 1.06  CALCIUM 8.6* 8.9   PT/INR No results for input(s): LABPROT, INR in the last 72 hours. ABG No results for input(s): PHART, HCO3 in the last 72 hours.  Invalid input(s): PCO2, PO2  Studies/Results: Ct Abdomen Pelvis W Contrast  Result Date: 01/13/2017 CLINICAL DATA:  RIGHT upper quadrant abdominal pain, RIGHT lower quadrant pain with palpation at office, nausea, decreased appetite, prior laparoscopic cholecystectomy, feels like abdomen is swollen EXAM: CT ABDOMEN AND PELVIS WITH CONTRAST TECHNIQUE: Multidetector CT imaging of the abdomen and pelvis was performed using the standard protocol following bolus administration of intravenous contrast. Sagittal and coronal MPR images reconstructed from  axial data set. CONTRAST:  132mL ISOVUE-300 IOPAMIDOL (ISOVUE-300) INJECTION 61% IV. No oral contrast. COMPARISON:  None FINDINGS: Lower chest: Lung bases clear Hepatobiliary: Post cholecystectomy. Minimal fatty infiltration of liver. No focal hepatic lesions. Pancreas: Normal appearance Spleen: Normal appearance.  Tiny splenule inferior to spleen. Adrenals/Urinary Tract: Adrenal glands normal appearance. Small nonobstructing calculus at inferior pole RIGHT kidney. Kidneys, ureters, and bladder normal appearance. Stomach/Bowel: Enlarged thickened appendix with periappendiceal inflammatory changes distally consistent with acute appendicitis. Small focal fluid collection adjacent to distal appendix 17 x 13 x 15 mm suspect developing abscess. Stomach and bowel loops otherwise normal appearance for technique. Vascular/Lymphatic: Minimal atherosclerotic calcification aorta and iliac arteries. No adenopathy. Reproductive: Minimal prostatic enlargement. Seminal vesicles unremarkable. Other: No free air or significant ascites.  No hernia. Musculoskeletal: Unremarkable IMPRESSION: Acute appendicitis with significant periappendiceal inflammatory changes at the distal appendix and question small developing periappendiceal abscess 17 x 13 x 15 mm in size. Electronically Signed   By: Lavonia Dana M.D.   On: 01/13/2017 19:15    Anti-infectives: Anti-infectives    Start     Dose/Rate Route Frequency Ordered Stop   01/14/17 0200  piperacillin-tazobactam (ZOSYN) IVPB 3.375 g     3.375 g 12.5 mL/hr over 240 Minutes Intravenous Every 8 hours 01/14/17 0144     01/13/17 1930  piperacillin-tazobactam (ZOSYN) IVPB 3.375 g     3.375 g 100 mL/hr over 30 Minutes Intravenous  Once 01/13/17 1924 01/13/17 2019      Assessment/Plan: s/p Procedure(s): APPENDECTOMY LAPAROSCOPIC  Continue IV and IV antibiotics. We'll place bladder back on clears and adjust pain medications to Dilaudid.  Florene Glen, MD,  FACS  01/14/2017

## 2017-01-14 NOTE — Anesthesia Postprocedure Evaluation (Signed)
Anesthesia Post Note  Patient: Tyrone Madden  Procedure(s) Performed: APPENDECTOMY LAPAROSCOPIC (N/A Abdomen)  Patient location during evaluation: PACU Anesthesia Type: General Level of consciousness: awake and alert and oriented Pain management: pain level controlled Vital Signs Assessment: post-procedure vital signs reviewed and stable Respiratory status: spontaneous breathing, nonlabored ventilation and respiratory function stable Cardiovascular status: blood pressure returned to baseline and stable Postop Assessment: no signs of nausea or vomiting Anesthetic complications: no     Last Vitals:  Vitals:   01/14/17 0134 01/14/17 0149  BP: 127/80 124/76  Pulse: 91 87  Resp: 18 16  Temp: 36.9 C 36.4 C  SpO2: 97% 95%    Last Pain:  Vitals:   01/14/17 0602  TempSrc:   PainSc: 10-Worst pain ever                 Torianne Laflam

## 2017-01-14 NOTE — Anesthesia Post-op Follow-up Note (Signed)
Anesthesia QCDR form completed.        

## 2017-01-14 NOTE — Progress Notes (Signed)
Assumed care of pt from periop @ 0145. A+Ox4. Dressings intact on lap sites. Jp drain intact with serosanguinous drainage. Pt medicated with Percocet and morphine for 7-10/10 pain. Pt has urge to void but is unable to, bladder scan showed 174ml. Will monitor for pt to void. Bed alarm placed for safety.

## 2017-01-14 NOTE — Op Note (Signed)
laparascopic appendectomy   Tyrone Madden Date of operation:  01/14/2017  Indications: The patient presented with a history of  abdominal pain. Workup has revealed findings consistent with acute appendicitis.  Pre-operative Diagnosis: Acute appendicitis without mention of peritonitis  Post-operative Diagnosis: Acute appendicitis with peritoneal abscess  Surgeon: Juanda Crumble T. Adonis Huguenin, MD, FACS  Anesthesia: General with endotracheal tube  Procedure Details  The patient was seen again in the preop area. The options of surgery versus observation were reviewed with the patient and/or family. The risks of bleeding, infection, recurrence of symptoms, negative laparoscopy, potential for an open procedure, bowel injury, abscess or infection, were all reviewed as well. The patient was taken to Operating Room, identified as Tyrone Madden and the procedure verified as laparoscopic appendectomy. A Time Out was held and the above information confirmed.  The patient was placed in the supine position and general anesthesia was induced.  Antibiotic prophylaxis was administered and VT E prophylaxis was in place. A Foley catheter was placed by the nursing staff.   The abdomen was prepped and draped in a sterile fashion. An infraumbilical incision was made. A Veress needle was placed and pneumoperitoneum was obtained. A 5 mm Optiview trocar port was placed without difficulty and the abdominal cavity was explored.  Under direct vision a 5 mm suprapubic port was placed and a 12 mm left lateral port was placed all under direct vision.  The appendix was identified and found to be acutely inflamed and adhered to the posterior aspect of the terminal ileum causing inflammatory changes to the ileum. There was evidence of rupture of the distal appendix with contained abscess that was entered into and removed via suction irrigator. The dissection was difficult secondary to the inflammatory attachments and the perforated state  of the appendix which required an additional 5 mm trocar replaced and left lower quadrant for the assistant to distract the small bowel for the dissection..   The appendix was carefully dissected. The base of the appendix was dissected out and divided with a standard load Endo GIA. The mesoappendix was divided with a Harmonic scalpel.  The appendix was passed out through the left lateral port site with the aid of an Endo Catch bag. The right lower quadrant and pelvis was then irrigated with copious amounts of normal saline which was aspirated. Inspection failed to identify any additional bleeding and there were no signs of bowel injury. Given the difficult dissection and the abscess cavity the decision was made to place a 15 [place] round Blake drain. It was inserted through the left lower quadrant trocar site and brought out through the lower midline. Under direct visualization it was placed into the right hemipelvis going pastor staple line. The omentum was brought down and covered the same area.   Again the right lower quadrant was inspected there was no sign of bleeding or bowel injury therefore pneumoperitoneum was released, all ports were removed. The left lower trocar site fascia was closed with a figure-of-eight 0 Vicryl, and the skin incisions were approximated with subcuticular 4-0 Monocryl, the drain was secured with a 3-0 nylon. Steri-Strips and Mastisol and sterile dressings were placed.  The patient tolerated the procedure well, there were no complications. The sponge lap and needle count were correct at the end of the procedure.  The patient was taken to the recovery room in stable condition to be admitted for continued care.  Findings: Ruptured appendicitis with abscess  Estimated Blood Loss: 100 mL  Specimens: appendix         Complications:  None                  Clayburn Pert MD, FACS

## 2017-01-14 NOTE — Brief Op Note (Signed)
01/13/2017 - 01/14/2017  12:49 AM  PATIENT:  Tyrone Madden  51 y.o. male  PRE-OPERATIVE DIAGNOSIS:  acute appendicitis  POST-OPERATIVE DIAGNOSIS:  Ruptured appendicitis  PROCEDURE:  Procedure(s): APPENDECTOMY LAPAROSCOPIC (N/A)  SURGEON:  Surgeon(s) and Role:    * Clayburn Pert, MD - Primary  PHYSICIAN ASSISTANT:   ASSISTANTS: none   ANESTHESIA:   general  EBL:  Total I/O In: 1000 [I.V.:1000] Out: -   BLOOD ADMINISTERED:none  DRAINS: (32fr) Blake drain(s) in the right hemipelvis   LOCAL MEDICATIONS USED:  MARCAINE   , XYLOCAINE  and Amount: 20 ml  SPECIMEN:  Source of Specimen:  appendix  DISPOSITION OF SPECIMEN:  PATHOLOGY  COUNTS:  YES  TOURNIQUET:  * No tourniquets in log *  DICTATION: .Dragon Dictation  PLAN OF CARE: Admit to inpatient   PATIENT DISPOSITION:  PACU - hemodynamically stable.   Delay start of Pharmacological VTE agent (>24hrs) due to surgical blood loss or risk of bleeding: no

## 2017-01-14 NOTE — Transfer of Care (Signed)
Immediate Anesthesia Transfer of Care Note  Patient: Tyrone Madden  Procedure(s) Performed: APPENDECTOMY LAPAROSCOPIC (N/A Abdomen)  Patient Location: PACU  Anesthesia Type:General  Level of Consciousness: awake, alert , oriented and patient cooperative  Airway & Oxygen Therapy: Patient Spontanous Breathing and Patient connected to face mask oxygen  Post-op Assessment: Report given to RN and Post -op Vital signs reviewed and stable  Post vital signs: Reviewed and stable  Last Vitals:  Vitals:   01/13/17 2225 01/14/17 0049  BP: 118/85 (!) 153/90  Pulse: 66 97  Resp: 17 18  Temp:  36.6 C  SpO2: 99% 96%    Last Pain:  Vitals:   01/14/17 0049  TempSrc:   PainSc: 8          Complications: No apparent anesthesia complications

## 2017-01-15 NOTE — Progress Notes (Signed)
2 Days Post-Op  Subjective: Feels better today but still in pain. Did not want to advance his diet too far. 2 fast.  Objective: Vital signs in last 24 hours: Temp:  [97.8 F (36.6 C)-98.3 F (36.8 C)] 97.8 F (36.6 C) (10/12 0439) Pulse Rate:  [63-73] 63 (10/12 0439) Resp:  [18] 18 (10/11 2014) BP: (117-127)/(75-87) 117/75 (10/12 0439) SpO2:  [96 %-97 %] 97 % (10/12 0439) Last BM Date: 01/13/17  Intake/Output from previous day: 10/11 0701 - 10/12 0700 In: 2689 [P.O.:660; I.V.:1953; IV Piggyback:76] Out: 2260 [Urine:2150; Drains:110] Intake/Output this shift: Total I/O In: 361 [I.V.:361] Out: 50 [Drains:50]  Physical exam:  Abdomen is soft nondistended minimally tender wounds are clean. Calves are nontender.  Lab Results: CBC   Recent Labs  01/13/17 1846 01/14/17 0408  WBC 10.5 13.5*  HGB 13.9 14.4  HCT 40.2 40.5  PLT 159 218   BMET  Recent Labs  01/13/17 1846 01/14/17 0408  NA 137 137  K 3.3* 4.0  CL 102 103  CO2 25 27  GLUCOSE 86 140*  BUN 16 14  CREATININE 1.01 1.06  CALCIUM 8.6* 8.9   PT/INR No results for input(s): LABPROT, INR in the last 72 hours. ABG No results for input(s): PHART, HCO3 in the last 72 hours.  Invalid input(s): PCO2, PO2  Studies/Results: Ct Abdomen Pelvis W Contrast  Result Date: 01/13/2017 CLINICAL DATA:  RIGHT upper quadrant abdominal pain, RIGHT lower quadrant pain with palpation at office, nausea, decreased appetite, prior laparoscopic cholecystectomy, feels like abdomen is swollen EXAM: CT ABDOMEN AND PELVIS WITH CONTRAST TECHNIQUE: Multidetector CT imaging of the abdomen and pelvis was performed using the standard protocol following bolus administration of intravenous contrast. Sagittal and coronal MPR images reconstructed from axial data set. CONTRAST:  171mL ISOVUE-300 IOPAMIDOL (ISOVUE-300) INJECTION 61% IV. No oral contrast. COMPARISON:  None FINDINGS: Lower chest: Lung bases clear Hepatobiliary: Post  cholecystectomy. Minimal fatty infiltration of liver. No focal hepatic lesions. Pancreas: Normal appearance Spleen: Normal appearance.  Tiny splenule inferior to spleen. Adrenals/Urinary Tract: Adrenal glands normal appearance. Small nonobstructing calculus at inferior pole RIGHT kidney. Kidneys, ureters, and bladder normal appearance. Stomach/Bowel: Enlarged thickened appendix with periappendiceal inflammatory changes distally consistent with acute appendicitis. Small focal fluid collection adjacent to distal appendix 17 x 13 x 15 mm suspect developing abscess. Stomach and bowel loops otherwise normal appearance for technique. Vascular/Lymphatic: Minimal atherosclerotic calcification aorta and iliac arteries. No adenopathy. Reproductive: Minimal prostatic enlargement. Seminal vesicles unremarkable. Other: No free air or significant ascites.  No hernia. Musculoskeletal: Unremarkable IMPRESSION: Acute appendicitis with significant periappendiceal inflammatory changes at the distal appendix and question small developing periappendiceal abscess 17 x 13 x 15 mm in size. Electronically Signed   By: Lavonia Dana M.D.   On: 01/13/2017 19:15    Anti-infectives: Anti-infectives    Start     Dose/Rate Route Frequency Ordered Stop   01/14/17 0200  piperacillin-tazobactam (ZOSYN) IVPB 3.375 g     3.375 g 12.5 mL/hr over 240 Minutes Intravenous Every 8 hours 01/14/17 0144     01/13/17 1930  piperacillin-tazobactam (ZOSYN) IVPB 3.375 g     3.375 g 100 mL/hr over 30 Minutes Intravenous  Once 01/13/17 1924 01/13/17 2019      Assessment/Plan: s/p Procedure(s): APPENDECTOMY LAPAROSCOPIC   Patient status post lap or scopic appendectomy for ruptured appendix. He is improving slowly. We'll advance diet slowly. Continue IV antibiotics at this time.  Florene Glen, MD, FACS  01/15/2017

## 2017-01-15 NOTE — Progress Notes (Signed)
Per Dr. Burt Knack okay to discontinue maintenance IV fluids as pt is saline locked.

## 2017-01-16 LAB — CBC WITH DIFFERENTIAL/PLATELET
Basophils Absolute: 0 10*3/uL (ref 0–0.1)
Basophils Relative: 0 %
EOS ABS: 0.2 10*3/uL (ref 0–0.7)
EOS PCT: 1 %
HCT: 41.4 % (ref 40.0–52.0)
HEMOGLOBIN: 14.3 g/dL (ref 13.0–18.0)
LYMPHS ABS: 1.1 10*3/uL (ref 1.0–3.6)
LYMPHS PCT: 10 %
MCH: 32 pg (ref 26.0–34.0)
MCHC: 34.5 g/dL (ref 32.0–36.0)
MCV: 92.7 fL (ref 80.0–100.0)
MONOS PCT: 8 %
Monocytes Absolute: 1 10*3/uL (ref 0.2–1.0)
Neutro Abs: 9.2 10*3/uL — ABNORMAL HIGH (ref 1.4–6.5)
Neutrophils Relative %: 81 %
PLATELETS: 242 10*3/uL (ref 150–440)
RBC: 4.47 MIL/uL (ref 4.40–5.90)
RDW: 13.2 % (ref 11.5–14.5)
WBC: 11.5 10*3/uL — AB (ref 3.8–10.6)

## 2017-01-16 LAB — SURGICAL PATHOLOGY

## 2017-01-16 MED ORDER — DOCUSATE SODIUM 100 MG PO CAPS
100.0000 mg | ORAL_CAPSULE | Freq: Every day | ORAL | Status: DC
  Administered 2017-01-16 – 2017-01-17 (×2): 100 mg via ORAL
  Filled 2017-01-16 (×2): qty 1

## 2017-01-16 MED ORDER — KETOROLAC TROMETHAMINE 30 MG/ML IJ SOLN
30.0000 mg | Freq: Four times a day (QID) | INTRAMUSCULAR | Status: DC
  Administered 2017-01-16 – 2017-01-17 (×5): 30 mg via INTRAVENOUS
  Filled 2017-01-16 (×5): qty 1

## 2017-01-16 NOTE — Progress Notes (Signed)
[facility] Day(s): 2.   Post op day(s): 3 Days Post-Op.   Interval History: Patient seen and examined, no acute events or new complaints overnight. Patient reports improved pain has been well controlled with ambulation and +flatus without BM, denies fever/chills, N/V, CP, or SOB. Patient requests to have drain removed and go home.  Review of Systems:  Constitutional: denies fever, chills  HEENT: denies cough or congestion  Respiratory: denies any shortness of breath  Cardiovascular: denies chest pain or palpitations  Gastrointestinal: abdominal pain, N/V, and bowel function as per interval history Genitourinary: denies burning with urination or urinary frequency Musculoskeletal: denies pain, decreased motor or sensation Integumentary: denies any other rashes or skin discolorations except post-surgical abdominal wounds Neurological: denies HA or vision/hearing changes   Vital signs in last 24 hours: [min-max] current  Temp:  [98.4 F (36.9 C)-98.7 F (37.1 C)] 98.7 F (37.1 C) (10/13 0427) Pulse Rate:  [62-73] 69 (10/13 0427) Resp:  [20] 20 (10/13 0427) BP: (114-135)/(80-85) 135/85 (10/13 0427) SpO2:  [96 %-97 %] 97 % (10/13 0427)     Height: 5\' 9"  (175.3 cm) Weight: 245 lb 12.8 oz (111.5 kg) BMI (Calculated): 36.28   Intake/Output this shift:  Total I/O In: 50 [IV Piggyback:50] Out: 70 [Drains:70]   Intake/Output last 2 shifts:  @IOLAST2SHIFTS @   Physical Exam:  Constitutional: alert, cooperative and no distress  HENT: normocephalic without obvious abnormality  Eyes: PERRL, EOM's grossly intact and symmetric  Neuro: CN II - XII grossly intact and symmetric without deficit  Respiratory: breathing non-labored at rest  Cardiovascular: regular rate and sinus rhythm  Gastrointestinal: soft, minimal peri-incisional tenderness to palpation, mild distention, dressings c/d/i except serosang staining on umbilical dressing, drainage bulb contains 70 mL  non-purulent serosanguinous fluid s/p emptied once earlier this morning Musculoskeletal: UE and LE FROM, no edema or wounds, motor and sensation grossly intact, NT   Labs:  CBC Latest Ref Rng & Units 01/16/2017 01/14/2017 01/13/2017  WBC 3.8 - 10.6 K/uL 11.5(H) 13.5(H) 10.5  Hemoglobin 13.0 - 18.0 g/dL 14.3 14.4 13.9  Hematocrit 40.0 - 52.0 % 41.4 40.5 40.2  Platelets 150 - 440 K/uL 242 218 159   CMP Latest Ref Rng & Units 01/14/2017 01/13/2017 01/13/2017  Glucose 65 - 99 mg/dL 140(H) 86 -  BUN 6 - 20 mg/dL 14 16 -  Creatinine 0.61 - 1.24 mg/dL 1.06 1.01 1.00  Sodium 135 - 145 mmol/L 137 137 -  Potassium 3.5 - 5.1 mmol/L 4.0 3.3(L) -  Chloride 101 - 111 mmol/L 103 102 -  CO2 22 - 32 mmol/L 27 25 -  Calcium 8.9 - 10.3 mg/dL 8.9 8.6(L) -  Total Protein 6.5 - 8.1 g/dL - 7.1 -  Total Bilirubin 0.3 - 1.2 mg/dL - 1.1 -  Alkaline Phos 38 - 126 U/L - 49 -  AST 15 - 41 U/L - 26 -  ALT 17 - 63 U/L - 36 -   Imaging studies: No new pertinent imaging studies   Assessment/Plan: (ICD-10's: K35.2) 51 y.o. male doing overall well with resolving post-operative ileus 3 Days Post-Op s/p laparoscopic appendectomy with drainage of peri-appendiceal abscess for acute appendicitis with intra-abdominal abscess, complicated by pertinent comorbidities including obesity (BMI 37), gout, BPH, and history of testicular cancer.   - pain control prn  - continue IV antibiotics for now  - advance to soft diet, PO medications   - monitor abdominal exam, bowel function, drainage volume   - follow-up gradually decreasing leukocytosis  tomorrow morning  - d/c planning with abx + drain unless volume decreases  - medical management of medical comorbidities  - DVT prophylaxis, ambulation encouraged  All of the above findings and recommendations were discussed with the patient and patient's RN, and all of patient's questions were answered to his expressed satisfaction.  -- Marilynne Drivers Rosana Hoes, MD, [place]:  [facility] - Partnering for exceptional care. Office: [phone]

## 2017-01-17 LAB — CBC
HCT: 42 % (ref 40.0–52.0)
HEMOGLOBIN: 14.6 g/dL (ref 13.0–18.0)
MCH: 32.1 pg (ref 26.0–34.0)
MCHC: 34.7 g/dL (ref 32.0–36.0)
MCV: 92.5 fL (ref 80.0–100.0)
PLATELETS: 256 10*3/uL (ref 150–440)
RBC: 4.54 MIL/uL (ref 4.40–5.90)
RDW: 13.1 % (ref 11.5–14.5)
WBC: 9.7 10*3/uL (ref 3.8–10.6)

## 2017-01-17 MED ORDER — OXYCODONE-ACETAMINOPHEN 5-325 MG PO TABS: 1.0000 | ORAL_TABLET | ORAL | 0 refills | Status: DC | PRN

## 2017-01-17 MED ORDER — AMOXICILLIN-POT CLAVULANATE 875-125 MG PO TABS: 1.0000 | ORAL_TABLET | Freq: Two times a day (BID) | ORAL | 0 refills | Status: AC

## 2017-01-17 NOTE — Discharge Summary (Signed)
Physician Discharge Summary  Patient ID: Tyrone Madden MRN: [id number] DOB/AGE: [birth year] 51 y.o.  Admit date: 01/13/2017 Discharge date: 01/17/2017  Admission Diagnoses:  Discharge Diagnoses:  Active Problems:   Ruptured appendicitis   Discharged Condition: good  Hospital Course: 51 y.o. male presented to [facility] ED for abdominal pain. Workup was found to be significant for CT imaging demonstrating acute appendicitis. Informed consent was obtained and documented, and patient underwent uneventful laparoscopic appendectomy with placement of drain for peri-appendiceal abscess Adonis Huguenin, 01/14/2017).  Post-operatively, patient's pain improved and advancement of patient's diet and ambulation were well-tolerated. The remainder of patient's hospital course was essentially unremarkable, and discharge planning was initiated accordingly with patient safely able to be discharged home with appropriate discharge instructions, antibiotics, pain control, and outpatient  follow-up after all of his questions were answered to his expressed satisfaction.  Consults: None  Significant Diagnostic Studies: labs: WBC 13.5 --> 9.7 and radiology: CT scan: acute appendicitis  Treatments: IV hydration, antibiotics: Zosyn and surgery: laparoscopic appendectomy  Discharge Exam: Blood pressure 118/64, pulse 63, temperature 98.6 F (37 C), temperature source Oral, resp. rate 16, height 5\' 9"  (1.753 m), weight 245 lb 12.8 oz (111.5 kg), SpO2 95 %. General appearance: alert, cooperative and no distress GI: soft and non-distended with mild peri-incisional tenderness to palpation, incisions well-approximated with steristrips in place, drain secured with moderate amount of serosanguinous fluid in bulb, incisions well-approximated without erythema or drainage  Disposition: Final discharge disposition not confirmed   Allergies as of 01/17/2017   No Known Allergies     Medication List    TAKE these medications    allopurinol 300 MG tablet Commonly known as:  ZYLOPRIM   amoxicillin-clavulanate 875-125 MG tablet Commonly known as:  AUGMENTIN Take 1 tablet by mouth 2 (two) times daily.   aspirin-acetaminophen-caffeine 250-250-65 MG tablet Commonly known as:  EXCEDRIN MIGRAINE Take 1 tablet by mouth every 6 (six) hours as needed for headache.   indomethacin 50 MG capsule Commonly known as:  INDOCIN Take 50 mg by mouth 2 (two) times daily with a meal.   oxyCODONE-acetaminophen 5-325 MG tablet Commonly known as:  PERCOCET/ROXICET Take 1-2 tablets by mouth every 4 (four) hours as needed for moderate pain.   tadalafil 20 MG tablet Commonly known as:  CIALIS Take 1 tablet (20 mg total) by mouth daily as needed for erectile dysfunction.      Follow-up [place]. Schedule an appointment as soon as possible for a visit in 1 week(s).   Specialty:  General Surgery Contact information: [address] [place]          Signed: Vickie Epley 01/17/2017, 10:32 AM

## 2017-01-17 NOTE — Discharge Instructions (Addendum)
In addition to included general post-operative instructions for Laparoscopic Appendectomy,  Diet: Resume home heart healthy diet.   Activity: No heavy lifting >20 pounds (children, pets, laundry, garbage) or strenuous activity until follow-up, but light activity and walking are encouraged. Do not drive or drink alcohol if taking narcotic pain medications.  Wound care: Drain care and record volume drained as instructed by RN prior to discharge. 2 days after surgery (Monday, 10/15), may shower/get incision wet with soapy water and pat dry (do not rub incisions), but no baths or submerging incision underwater until follow-up.   Medications: Resume all home medications AND complete course of prescribed antibiotics even if feeling better/well. For mild to moderate pain: acetaminophen (Tylenol) or ibuprofen (if no kidney disease). Combining Tylenol with alcohol can substantially increase your risk of causing liver disease. Narcotic pain medications, if prescribed, can be used for severe pain, though may cause nausea, constipation, and drowsiness. Do not combine Tylenol and Percocet within a 6 hour period as Percocet contains Tylenol. If you do not need the narcotic pain medication, you do not need to fill the prescription.  Call office [phone]) at any time if any questions, worsening pain, fevers/chills, bleeding, drainage from incision site, or other concerns.

## 2017-01-18 ENCOUNTER — Telehealth: Payer: Self-pay | Admitting: General Surgery

## 2017-01-18 NOTE — Telephone Encounter (Signed)
Patient discharged 01/17/17 with drain. Needs to make a follow up but was wondering if he can do that this week rather than waiting to remove drain. Please advise

## 2017-01-18 NOTE — Telephone Encounter (Signed)
appt made with Dr Adonis Huguenin for 01/21/17.

## 2017-01-19 ENCOUNTER — Telehealth: Payer: Self-pay

## 2017-01-19 NOTE — Telephone Encounter (Signed)
Post-op call made to patient at this time. Spoke with Tyrone Madden. Post-op interview questions below.  1. How are you feeling? Patient stated that he is feeling pretty good  2. Is your pain controlled? Yes  3. What are you doing for the pain? Taking pain medication as needed  4. Are you having any Nausea or Vomiting? None  5. Are you having any Fever or Chills? None  6. Are you having any Constipation or Diarrhea? Having some diarrhea. Advised to stay hydrated.  7. Is there any Swelling or Bruising you are concerned about? None  8. Do you have any questions or concerns at this time? None at this time   Discussion: Reminded of appointment on 10/18 at 1:45PM.

## 2017-01-20 ENCOUNTER — Other Ambulatory Visit: Payer: Self-pay

## 2017-01-21 ENCOUNTER — Encounter: Payer: Self-pay | Admitting: General Surgery

## 2017-01-21 ENCOUNTER — Ambulatory Visit (INDEPENDENT_AMBULATORY_CARE_PROVIDER_SITE_OTHER): Payer: 59 | Admitting: General Surgery

## 2017-01-21 VITALS — BP 134/90 | HR 62 | Temp 97.9°F | Ht 68.0 in | Wt 229.4 lb

## 2017-01-21 DIAGNOSIS — Z4889 Encounter for other specified surgical aftercare: Secondary | ICD-10-CM

## 2017-01-21 NOTE — Progress Notes (Signed)
Outpatient Surgical Follow Up  01/21/2017  Tyrone Madden is an 51 y.o. male.   Chief Complaint  Patient presents with  . Routine Post Op    Laparoscopic Appendectomy-01/13/17-Dr.Egan Berkheimer    HPI: 78-year-old male returns to clinic 1 week status post laparoscopic appendectomy for a perforated appendicitis with abscess. Patient reports his pain is continuing to improve. He is tolerating et and having normal bowel function. Having discomfort at his drain site in his low midline but states it is improving and he strongly desires to have his drain removed. He denies any fevers, chills, nausea, vomiting, chest pain, shortness of breath, diarrhea, constipation.  Past Medical History:  Diagnosis Date  . Appendicitis, acute   . Blood glucose elevated 05/30/2015  . BPH (benign prostatic hyperplasia)   . Decreased testosterone level 05/30/2015  . Elevated alanine aminotransferase (ALT) level 03/26/2015  . Elevated hemoglobin ([place])   . Gout   . History of testicular cancer   . Hypogonadism in male   . Over weight   . Pure hypercholesterolemia 05/30/2015  . Ruptured appendicitis 01/14/2017    Past Surgical History:  Procedure Laterality Date  . CHOLECYSTECTOMY    . GALLBLADDER SURGERY    . LAPAROSCOPIC APPENDECTOMY N/A 01/13/2017   Procedure: APPENDECTOMY LAPAROSCOPIC;  Surgeon: Clayburn Pert, MD;  Location: [facility] ORS;  Service: General;  Laterality: N/A;  . orchiectomy unilateral      Family History  Problem Relation Age of Onset  . Heart disease Unknown   . Prostate cancer Neg Hx   . Bladder Cancer Neg Hx   . Kidney disease Neg Hx   . Kidney cancer Neg Hx     Social History:  reports that he has never smoked. He has never used smokeless tobacco. He reports that he drinks alcohol. He reports that he does not use drugs.  Allergies: No Known Allergies  Medications reviewed.    ROS A multipoint review of systems was completed, all pertinent positives and negatives are  documented in the history of present illness and remainder are negative   BP 134/90   Pulse 62   Temp 97.9 F (36.6 C) (Oral)   Ht 5\' 8"  (1.727 m)   Wt 104.1 kg (229 lb 6.4 oz)   BMI 34.88 kg/m   Physical Exam Gen.: No acute distress  chest: Clear to auscultation  heart: Regular rhythm Abdomen: Soft, appropriately tender to palpation at incision sites, nondistended JP drain in the lower midline with a serous and was output. Well approximated laparoscopic incisions to the periumbilical and left lower and upper abdomen.No evidence of erythema or infection    No results found for this or any previous visit (from the past 48 hour(s)). No results found.  Assessment/Plan:  1. Aftercare following surgery 51 year old male 1 week status post laparoscopic appendectomy. Pathology reviewed. Drain removed without difficulty. Instructed patient to continue his antibiotics until they're completed. Follow-up in clinic in 1 week for additional wound check prior to being dismissed to return to work.     Clayburn Pert, MD FACS General Surgeon  01/21/2017,1:59 PM

## 2017-01-21 NOTE — Patient Instructions (Signed)
We have removed your drain today. You may notice some drainage from the drain site. You will need to keep a dressing over the area as long as there is drainage coming from this area.  Please see your follow up appointment listed below.

## 2017-01-28 ENCOUNTER — Encounter: Payer: Self-pay | Admitting: Surgery

## 2017-01-28 ENCOUNTER — Ambulatory Visit (INDEPENDENT_AMBULATORY_CARE_PROVIDER_SITE_OTHER): Payer: 59 | Admitting: Surgery

## 2017-01-28 VITALS — BP 134/81 | HR 72 | Temp 97.8°F | Ht 68.0 in | Wt 230.6 lb

## 2017-01-28 DIAGNOSIS — Z4889 Encounter for other specified surgical aftercare: Secondary | ICD-10-CM

## 2017-01-28 NOTE — Patient Instructions (Signed)
Please see your follow up appointment listed below.  °

## 2017-01-28 NOTE — Progress Notes (Signed)
Outpatient postop visit  01/28/2017  Tyrone Madden is an 51 y.o. male.    Procedure: Laparoscopic appendectomy  CC: No problems HPI: This patient status post laparoscopic appendectomy by Dr. Lanetta Inch it was ruptured with small abscess at the time. Patient wants to go to work he works at the [employer].  He is eating well no fevers or chills Medications reviewed.    Physical Exam:  There were no vitals taken for this visit.    PE: Wounds healing well no erythema no drainage    Assessment/Plan:  Pathology reviewed.  There is no sign of malignancy but there is sign of ruptured appendix with abscess.  Patient doing very well recommend follow-up on an as-needed basis reminded of no heavy lifting for another 2 weeks.  Florene Glen, MD, FACS

## 2017-02-02 ENCOUNTER — Telehealth: Payer: Self-pay | Admitting: General Surgery

## 2017-02-02 DIAGNOSIS — Z4889 Encounter for other specified surgical aftercare: Secondary | ICD-10-CM

## 2017-02-02 NOTE — Telephone Encounter (Signed)
Patient came by and dropped off fmla paperwork, payment has been paid and placed in the folder up front.

## 2017-02-05 NOTE — Telephone Encounter (Signed)
FLMA forms have been completed and faxed to [phone].

## 2017-02-09 ENCOUNTER — Other Ambulatory Visit: Payer: Self-pay

## 2017-02-12 ENCOUNTER — Encounter: Payer: 59 | Admitting: General Surgery

## 2017-02-12 ENCOUNTER — Encounter: Payer: Self-pay | Admitting: General Surgery

## 2017-02-12 ENCOUNTER — Ambulatory Visit (INDEPENDENT_AMBULATORY_CARE_PROVIDER_SITE_OTHER): Payer: 59 | Admitting: General Surgery

## 2017-02-12 VITALS — BP 128/79 | HR 76 | Temp 98.1°F | Ht 68.0 in | Wt 232.8 lb

## 2017-02-12 DIAGNOSIS — Z4889 Encounter for other specified surgical aftercare: Secondary | ICD-10-CM

## 2017-02-12 NOTE — Patient Instructions (Addendum)
Please call our office with any questions or concerns. 

## 2017-02-12 NOTE — Progress Notes (Signed)
Outpatient Surgical Follow Up  02/12/2017  Tyrone Madden is an 51 y.o. male.   Chief Complaint  Patient presents with  . Routine Post Op    Laparoscopic Appendectomy-01/13/17 Dr.Alizandra Loh    HPI: 50 year old male returns to clinic approximately 1 month status post laparoscopic appendectomy for repeat abdominal check before he returns to work.  He states he has been having a weird sensation of swelling on the right side but denies any abdominal pain.  He is eating well and having normal bowel function.  He denies any fevers, chills, nausea, vomiting, chest pain, shortness of breath, diarrhea, constipation.  Past Medical History:  Diagnosis Date  . Appendicitis, acute   . Blood glucose elevated 05/30/2015  . BPH (benign prostatic hyperplasia)   . Decreased testosterone level 05/30/2015  . Elevated alanine aminotransferase (ALT) level 03/26/2015  . Elevated hemoglobin ([place])   . Gout   . History of testicular cancer   . Hypogonadism in male   . Over weight   . Pure hypercholesterolemia 05/30/2015  . Ruptured appendicitis 01/14/2017    Past Surgical History:  Procedure Laterality Date  . CHOLECYSTECTOMY    . GALLBLADDER SURGERY    . orchiectomy unilateral      Family History  Problem Relation Age of Onset  . Heart disease Unknown   . Prostate cancer Neg Hx   . Bladder Cancer Neg Hx   . Kidney disease Neg Hx   . Kidney cancer Neg Hx     Social History:  reports that  has never smoked. he has never used smokeless tobacco. He reports that he drinks alcohol. He reports that he does not use drugs.  Allergies: No Known Allergies  Medications reviewed.    ROS A multipoint review of systems was completed, all pertinent positives and negatives are documented in the HPI the remainder are negative   BP 128/79   Pulse 76   Temp 98.1 F (36.7 C) (Oral)   Ht 5\' 8"  (1.727 m)   Wt 105.6 kg (232 lb 12.8 oz)   BMI 35.40 kg/m   Physical Exam General: No acute distress Chest:  Clear all station Heart: Regular rate and rhythm Abdomen: Soft, nontender, nondistended.  Well-healed laparoscopic incision sites.    No results found for this or any previous visit (from the past 48 hour(s)). No results found.  Assessment/Plan:  1. Aftercare following surgery 51 year old male 1 month status post laparoscopic appendectomy for ruptured appendicitis.  Doing very well.  Discussed the signs and symptoms of intra-abdominal abscess and to return to clinic immediately should they occur.  Otherwise reassured the patient that he was having normal healing sensations there was no concerning findings on exam today.  Patient will follow-up in clinic on an as-needed basis.     Clayburn Pert, MD FACS General Surgeon  02/12/2017,12:28 PM

## 2017-04-09 DIAGNOSIS — K5792 Diverticulitis of intestine, part unspecified, without perforation or abscess without bleeding: Secondary | ICD-10-CM | POA: Diagnosis not present

## 2017-05-19 DIAGNOSIS — M10072 Idiopathic gout, left ankle and foot: Secondary | ICD-10-CM | POA: Diagnosis not present

## 2017-05-25 DIAGNOSIS — D165 Benign neoplasm of lower jaw bone: Secondary | ICD-10-CM | POA: Diagnosis not present

## 2017-05-28 DIAGNOSIS — Z79899 Other long term (current) drug therapy: Secondary | ICD-10-CM | POA: Diagnosis not present

## 2017-05-28 DIAGNOSIS — E782 Mixed hyperlipidemia: Secondary | ICD-10-CM | POA: Diagnosis not present

## 2017-05-28 DIAGNOSIS — Z131 Encounter for screening for diabetes mellitus: Secondary | ICD-10-CM | POA: Diagnosis not present

## 2017-06-04 DIAGNOSIS — Z0000 Encounter for general adult medical examination without abnormal findings: Secondary | ICD-10-CM | POA: Diagnosis not present

## 2017-06-04 DIAGNOSIS — R739 Hyperglycemia, unspecified: Secondary | ICD-10-CM | POA: Diagnosis not present

## 2017-06-04 DIAGNOSIS — Z13220 Encounter for screening for lipoid disorders: Secondary | ICD-10-CM | POA: Diagnosis not present

## 2017-07-20 DIAGNOSIS — M10072 Idiopathic gout, left ankle and foot: Secondary | ICD-10-CM | POA: Diagnosis not present

## 2017-08-26 ENCOUNTER — Ambulatory Visit: Payer: 59 | Admitting: Nurse Practitioner

## 2017-08-26 ENCOUNTER — Other Ambulatory Visit: Payer: 59

## 2017-09-24 ENCOUNTER — Inpatient Hospital Stay: Payer: 59 | Attending: Oncology

## 2017-09-24 ENCOUNTER — Inpatient Hospital Stay: Payer: 59 | Admitting: Oncology

## 2017-09-24 ENCOUNTER — Encounter: Payer: Self-pay | Admitting: Oncology

## 2017-09-24 VITALS — BP 104/66 | HR 67 | Temp 97.3°F | Resp 18 | Ht 68.0 in | Wt 234.9 lb

## 2017-09-24 DIAGNOSIS — D751 Secondary polycythemia: Secondary | ICD-10-CM

## 2017-09-24 LAB — CBC
HEMATOCRIT: 43.7 % (ref 40.0–52.0)
Hemoglobin: 15.3 g/dL (ref 13.0–18.0)
MCH: 31.8 pg (ref 26.0–34.0)
MCHC: 35 g/dL (ref 32.0–36.0)
MCV: 90.9 fL (ref 80.0–100.0)
PLATELETS: 207 10*3/uL (ref 150–440)
RBC: 4.8 MIL/uL (ref 4.40–5.90)
RDW: 13.7 % (ref 11.5–14.5)
WBC: 7.9 10*3/uL (ref 3.8–10.6)

## 2017-09-24 NOTE — Progress Notes (Signed)
Increase right sided pain( feeling like something out of place)

## 2017-09-27 NOTE — Progress Notes (Signed)
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 According to notes he also received one cycle of carboplatinum in 2006. Patient reports overall feeling very well.  2. Patient is on testosterone supplements through urology for hypogonadism. He has developed secondary polycythemia from it. Previously he was getting 15 pellets every 6 months. Now gets 6 every 3 months   Interval history- Patient states that his recent testosterone level was normal and he is off testosterone replacement now for 6 months. He does not f/u with urology anymore. He does have some chronic fatigue. Denies other complaints  ECOG PS- 0 Pain scale- 0  Review of systems- Review of Systems  Constitutional: Positive for malaise/fatigue. Negative for chills, fever and weight loss.  HENT: Negative for congestion, ear discharge and nosebleeds.   Eyes: Negative for blurred vision.  Respiratory: Negative for cough, hemoptysis, sputum production, shortness of breath and wheezing.   Cardiovascular: Negative for chest pain, palpitations, orthopnea and claudication.  Gastrointestinal: Negative for abdominal pain, blood in stool, constipation, diarrhea, heartburn, melena, nausea and vomiting.  Genitourinary: Negative for dysuria, flank pain, frequency, hematuria and urgency.  Musculoskeletal: Negative for back pain, joint pain and myalgias.  Skin: Negative for rash.  Neurological: Negative for dizziness, tingling, focal weakness, seizures,  weakness and headaches.  Endo/Heme/Allergies: Does not bruise/bleed easily.  Psychiatric/Behavioral: Negative for depression and suicidal ideas. The patient does not have insomnia.       No Known Allergies   Past Medical History:  Diagnosis Date  . Appendicitis, acute   . Blood glucose elevated 05/30/2015  . BPH (benign prostatic hyperplasia)   . Decreased testosterone level 05/30/2015  . Elevated alanine aminotransferase (ALT) level 03/26/2015  . Elevated hemoglobin ([place])   . Gout   . History of testicular cancer   . Hypogonadism in male   . Over weight   . Pure hypercholesterolemia 05/30/2015  . Ruptured appendicitis 01/14/2017     Past Surgical History:  Procedure Laterality Date  . CHOLECYSTECTOMY    . GALLBLADDER SURGERY    . LAPAROSCOPIC APPENDECTOMY N/A 01/13/2017   Procedure: APPENDECTOMY LAPAROSCOPIC;  Surgeon: Clayburn Pert, MD;  Location: [facility] ORS;  Service: General;  Laterality: N/A;  . orchiectomy unilateral      Social History   Socioeconomic History  . Marital status: Married    Spouse name: Not on file  . Number of children: Not on file  . Years of education: Not on file  . Highest education level: Not on file  Occupational History  . Not on file  Social Needs  . Financial resource strain: Not on file  . Food insecurity:    Worry: Not on file    Inability: Not on file  . Transportation needs:    Medical: Not on file    Non-medical: Not on file  Tobacco Use  . Smoking status: Never Smoker  . Smokeless tobacco: Never Used  Substance and Sexual Activity  . Alcohol use: Yes    Alcohol/week: 0.0 oz    Comment: occasional  . Drug use: No  . Sexual  activity: Not on file  Lifestyle  . Physical activity:    Days per week: Not on file    Minutes per session: Not on file  . Stress: Not on file  Relationships  . Social connections:    Talks on phone: Not on file    Gets together: Not on file    Attends religious service: Not on file     Active member of club or organization: Not on file    Attends meetings of clubs or organizations: Not on file    Relationship status: Not on file  . Intimate partner violence:    Fear of current or ex partner: Not on file    Emotionally abused: Not on file    Physically abused: Not on file    Forced sexual activity: Not on file  Other Topics Concern  . Not on file  Social History Narrative  . Not on file    Family History  Problem Relation Age of Onset  . Heart disease Unknown   . Prostate cancer Neg Hx   . Bladder Cancer Neg Hx   . Kidney disease Neg Hx   . Kidney cancer Neg Hx      Current Outpatient Medications:  .  allopurinol (ZYLOPRIM) 300 MG tablet, Take 600 mg by mouth daily. , Disp: , Rfl:  .  indomethacin (INDOCIN) 50 MG capsule, Take 50 mg by mouth 2 (two) times daily with a meal., Disp: , Rfl:  .  tadalafil (CIALIS) 20 MG tablet, Take 1 tablet (20 mg total) by mouth daily as needed for erectile dysfunction. (Patient not taking: Reported on 09/24/2017), Disp: 6 tablet, Rfl: 12  Current Facility-Administered Medications:  .  Testosterone PLLT 75 mg, 75 mg, Implant, Once, McGowan, Shannon A, PA-C .  Testosterone PLLT 75 mg, 75 mg, Implant, Once, McGowan, Shannon A, PA-C  Physical exam:  Vitals:   09/24/17 1456  BP: 104/66  Pulse: 67  Resp: 18  Temp: (!) 97.3 F (36.3 C)  TempSrc: Tympanic  SpO2: 97%  Weight: 234 lb 14.4 oz (106.5 kg)  Height: 5\' 8"  (1.727 m)   Physical Exam  Constitutional: He is oriented to person, place, and time. He appears well-developed and well-nourished.  HENT:  Head: Normocephalic and atraumatic.  Eyes: Pupils are equal, round, and reactive to light. EOM are normal.  Neck: Normal range of motion.  Cardiovascular: Normal rate, regular rhythm and normal heart sounds.  Pulmonary/Chest: Effort normal and breath sounds normal.  Abdominal: Soft. Bowel sounds are normal.  Neurological: He is alert and oriented to person, place, and  time.  Skin: Skin is warm and dry.     CMP Latest Ref Rng & Units 01/14/2017  Glucose 65 - 99 mg/dL 140(H)  BUN 6 - 20 mg/dL 14  Creatinine 0.61 - 1.24 mg/dL 1.06  Sodium 135 - 145 mmol/L 137  Potassium 3.5 - 5.1 mmol/L 4.0  Chloride 101 - 111 mmol/L 103  CO2 22 - 32 mmol/L 27  Calcium 8.9 - 10.3 mg/dL 8.9  Total Protein 6.5 - 8.1 g/dL -  Total Bilirubin 0.3 - 1.2 mg/dL -  Alkaline Phos 38 - 126 U/L -  AST 15 - 41 U/L -  ALT 17 - 63 U/L -   CBC Latest Ref Rng & Units 09/24/2017  WBC 3.8 - 10.6 K/uL 7.9  Hemoglobin 13.0 - 18.0 g/dL 15.3  Hematocrit 40.0 - 52.0 % 43.7  Platelets 150 - 440 K/uL 207    Assessment and  plan- Patient is a 52 y.o. 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 He has not had any recurrence of his polycythemia and his hct is now consistently <50. He has nto required phlebotomy for 2 years now. He can continue to follow up with Dr. Baldemar Lenis at this time and can be referred to [place] in the future if there are any questions or concerns. I do not expect his polycythemia to recur in the absence of testosterone replacement.   Visit Diagnosis 1. Polycythemia, secondary      Dr. Randa Evens, MD, MPH [facility] at [facility] [phone] 09/27/2017 8:09 AM

## 2017-11-16 DIAGNOSIS — H5213 Myopia, bilateral: Secondary | ICD-10-CM | POA: Diagnosis not present

## 2017-12-30 DIAGNOSIS — R1032 Left lower quadrant pain: Secondary | ICD-10-CM | POA: Diagnosis not present

## 2017-12-30 DIAGNOSIS — R1031 Right lower quadrant pain: Secondary | ICD-10-CM | POA: Diagnosis not present

## 2018-01-10 ENCOUNTER — Other Ambulatory Visit: Payer: Self-pay | Admitting: Student

## 2018-01-10 DIAGNOSIS — R1032 Left lower quadrant pain: Secondary | ICD-10-CM

## 2018-01-12 ENCOUNTER — Ambulatory Visit
Admission: RE | Admit: 2018-01-12 | Discharge: 2018-01-12 | Disposition: A | Discharge status: Home / Self Care | Payer: 59 | Source: Ambulatory Visit | Attending: Student | Admitting: Student

## 2018-01-12 DIAGNOSIS — Z8547 Personal history of malignant neoplasm of testis: Secondary | ICD-10-CM | POA: Diagnosis not present

## 2018-01-12 DIAGNOSIS — K760 Fatty (change of) liver, not elsewhere classified: Secondary | ICD-10-CM | POA: Diagnosis not present

## 2018-01-12 DIAGNOSIS — R1032 Left lower quadrant pain: Secondary | ICD-10-CM | POA: Diagnosis not present

## 2018-01-12 HISTORY — DX: Malignant (primary) neoplasm, unspecified: C80.1

## 2018-01-12 MED ORDER — IOHEXOL 300 MG/ML  SOLN
150.0000 mL | Freq: Once | INTRAMUSCULAR | Status: AC | PRN
  Administered 2018-01-12: 100 mL via INTRAVENOUS

## 2018-01-18 ENCOUNTER — Ambulatory Visit: Payer: 59

## 2018-03-07 DIAGNOSIS — R1011 Right upper quadrant pain: Secondary | ICD-10-CM | POA: Diagnosis not present

## 2018-03-07 DIAGNOSIS — E669 Obesity, unspecified: Secondary | ICD-10-CM | POA: Diagnosis not present

## 2018-03-07 DIAGNOSIS — E7800 Pure hypercholesterolemia, unspecified: Secondary | ICD-10-CM | POA: Diagnosis not present

## 2018-03-18 DIAGNOSIS — R1011 Right upper quadrant pain: Secondary | ICD-10-CM | POA: Diagnosis not present

## 2018-03-18 DIAGNOSIS — K2970 Gastritis, unspecified, without bleeding: Secondary | ICD-10-CM | POA: Diagnosis not present

## 2018-03-18 DIAGNOSIS — K3189 Other diseases of stomach and duodenum: Secondary | ICD-10-CM | POA: Diagnosis not present

## 2018-03-18 DIAGNOSIS — K2950 Unspecified chronic gastritis without bleeding: Secondary | ICD-10-CM | POA: Diagnosis not present

## 2018-04-27 DIAGNOSIS — M25562 Pain in left knee: Secondary | ICD-10-CM | POA: Diagnosis not present

## 2018-06-06 DIAGNOSIS — Z13220 Encounter for screening for lipoid disorders: Secondary | ICD-10-CM | POA: Diagnosis not present

## 2018-06-06 DIAGNOSIS — Z79899 Other long term (current) drug therapy: Secondary | ICD-10-CM | POA: Diagnosis not present

## 2018-06-06 DIAGNOSIS — R739 Hyperglycemia, unspecified: Secondary | ICD-10-CM | POA: Diagnosis not present

## 2018-06-07 DIAGNOSIS — E7800 Pure hypercholesterolemia, unspecified: Secondary | ICD-10-CM | POA: Diagnosis not present

## 2018-06-07 DIAGNOSIS — R739 Hyperglycemia, unspecified: Secondary | ICD-10-CM | POA: Diagnosis not present

## 2018-06-07 DIAGNOSIS — Z0000 Encounter for general adult medical examination without abnormal findings: Secondary | ICD-10-CM | POA: Diagnosis not present

## 2018-10-11 IMAGING — CT CT ABD-PELV W/ CM
2 of 5 series · 16 of 46 positions shown, 18 images · IV contrast (APPLIED)
Comparison: None

CLINICAL DATA: RIGHT upper quadrant abdominal pain, RIGHT lower
quadrant pain with palpation at office, nausea, decreased appetite,
prior laparoscopic cholecystectomy, feels like abdomen is swollen

EXAM:
CT ABDOMEN AND PELVIS WITH CONTRAST
TECHNIQUE: Multidetector CT imaging of the abdomen and pelvis was performed
using the standard protocol following bolus administration of
intravenous contrast. Sagittal and coronal MPR images reconstructed
from axial data set.
CONTRAST:  100mL 22GHX2-YXX IOPAMIDOL (22GHX2-YXX) INJECTION 61% IV.
No oral contrast.

[Series 2: routine abd/pel with · axial · 0.86mm/px · z∈[-1008,-563]mm · 13 of 103 slices shown, 15 images]
[im 7/103  soft-tissue]
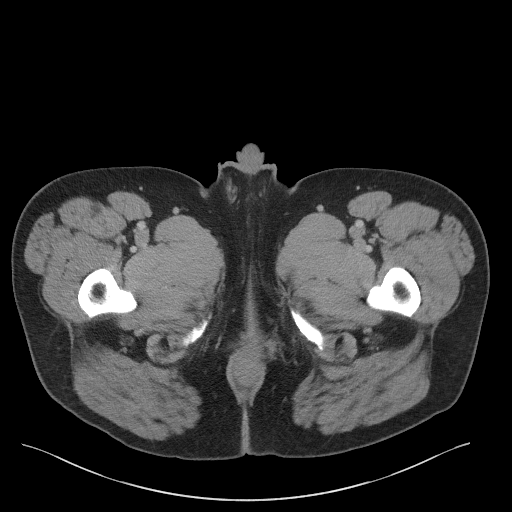
[im 7/103  bone]
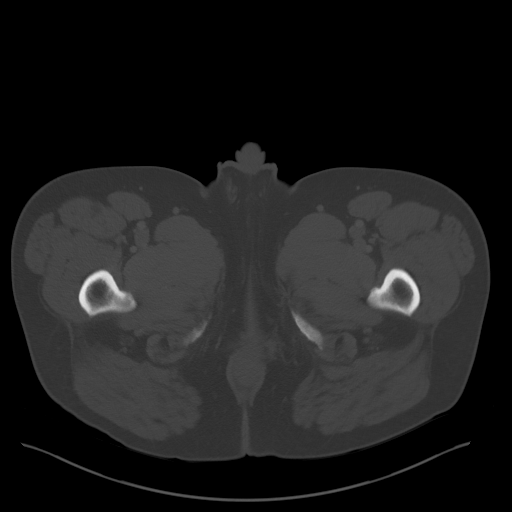
[im 13/103  soft-tissue]
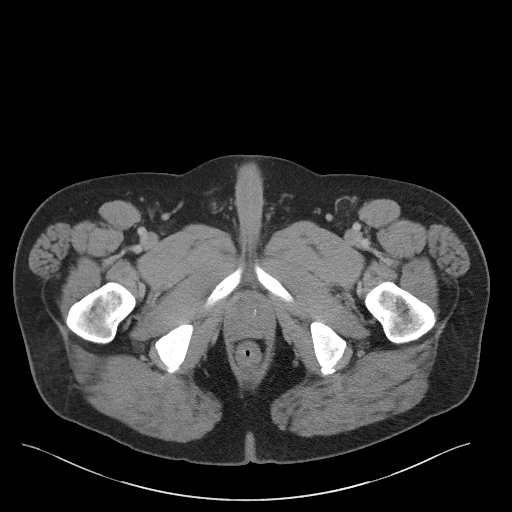
[im 20/103  soft-tissue]
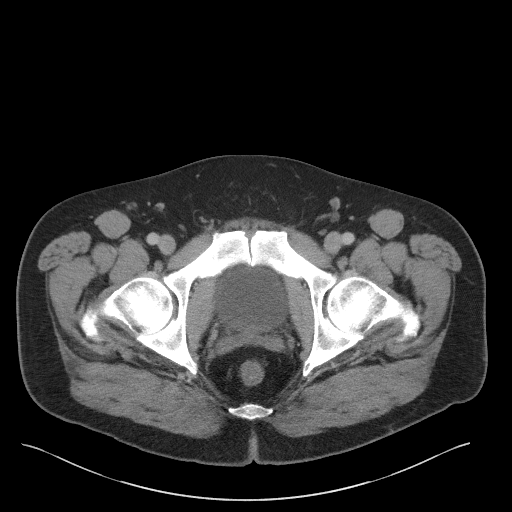
[im 32/103  soft-tissue]
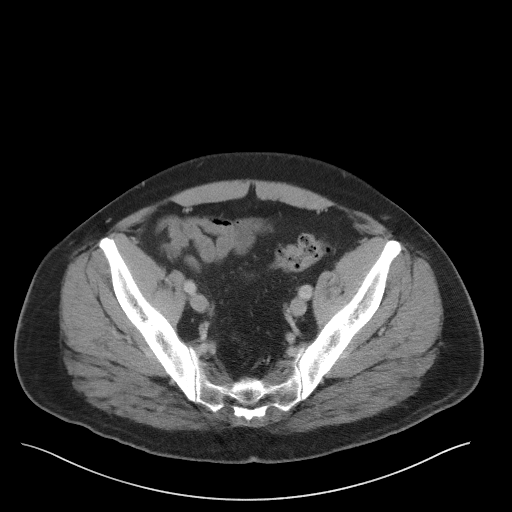
[im 39/103  soft-tissue]
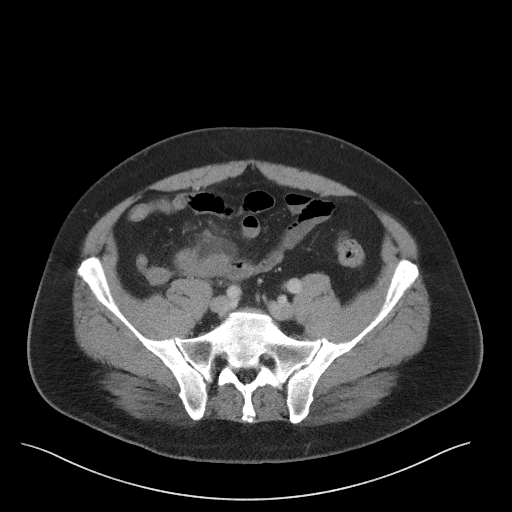
[im 45/103  soft-tissue]
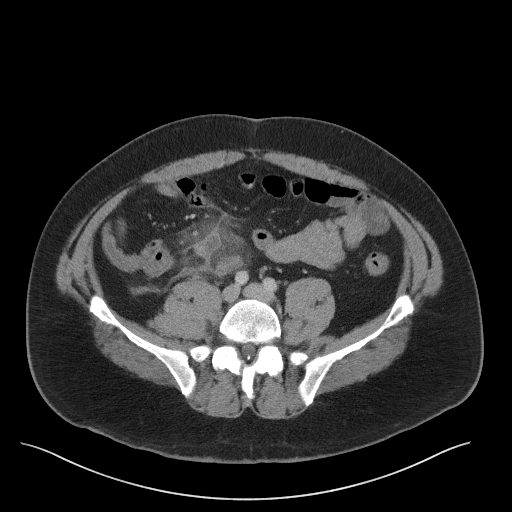
[im 52/103  soft-tissue]
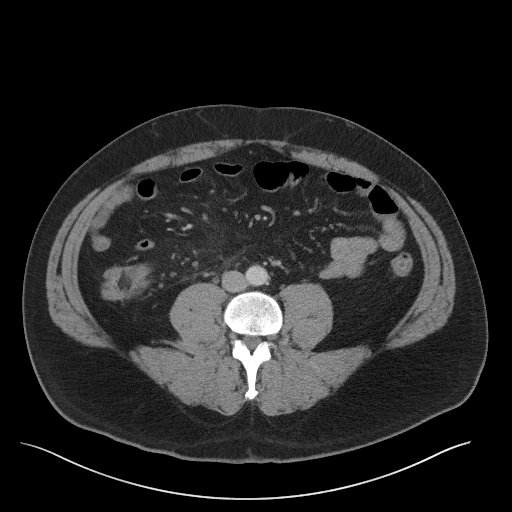
[im 58/103  soft-tissue]
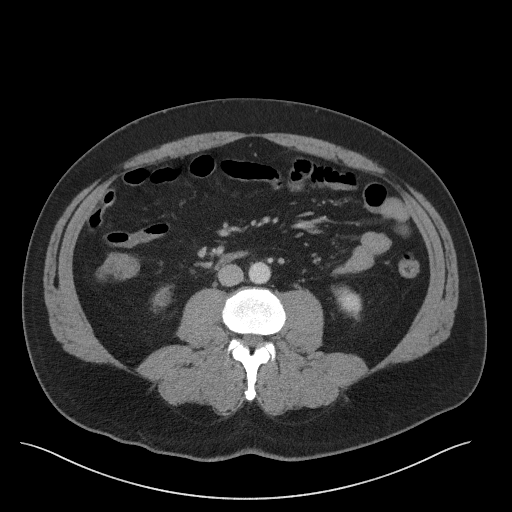
[im 64/103  soft-tissue]
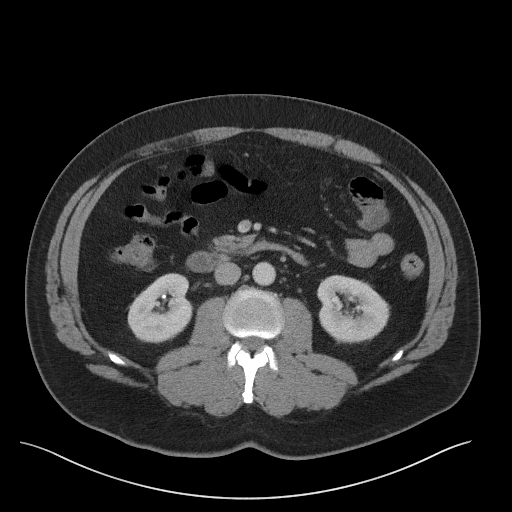
[im 64/103  bone]
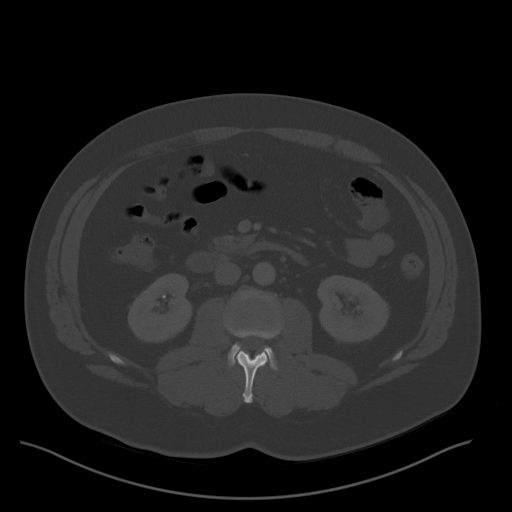
[im 71/103  soft-tissue]
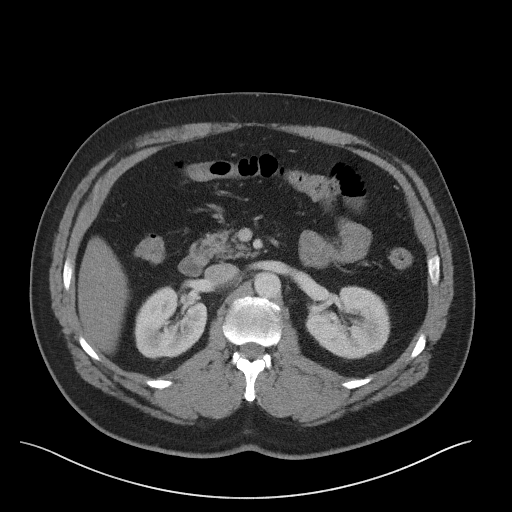
[im 83/103  soft-tissue]
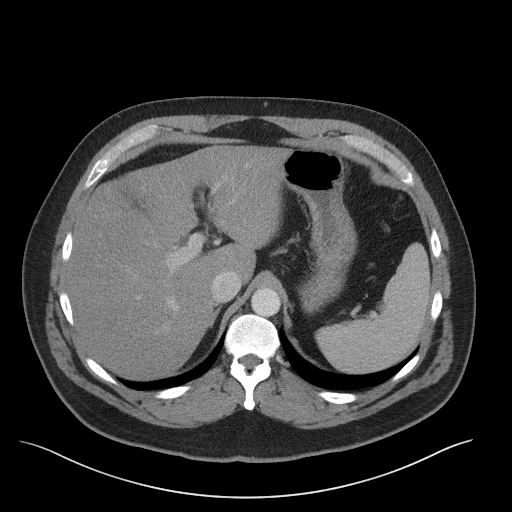
[im 90/103  soft-tissue]
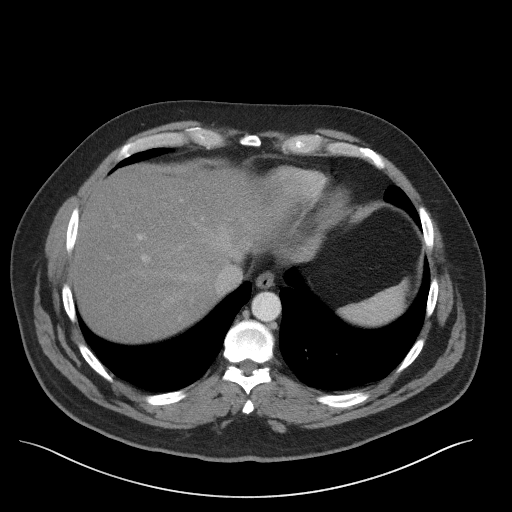
[im 96/103  soft-tissue]
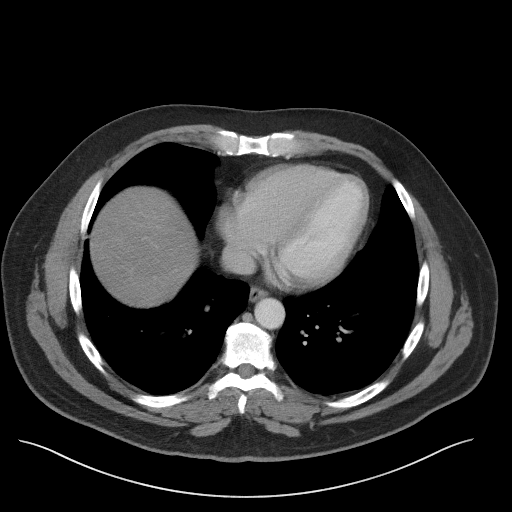

[Series 5: coronal st · coronal · 0.86mm/px · 3 of 93 slices shown]
[im 31/93  soft-tissue]
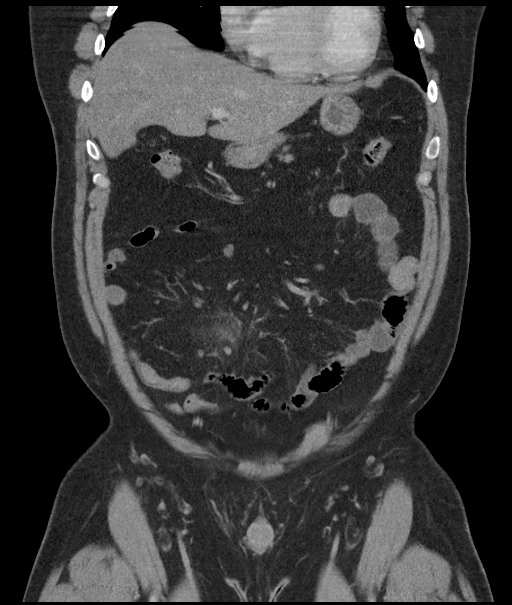
[im 41/93  soft-tissue]
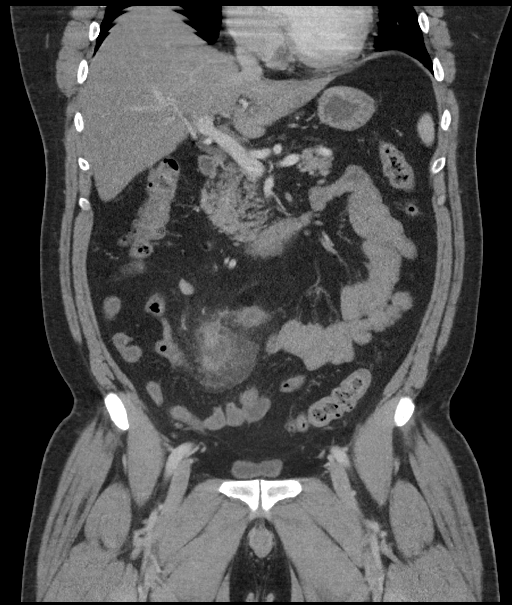
[im 52/93  soft-tissue]
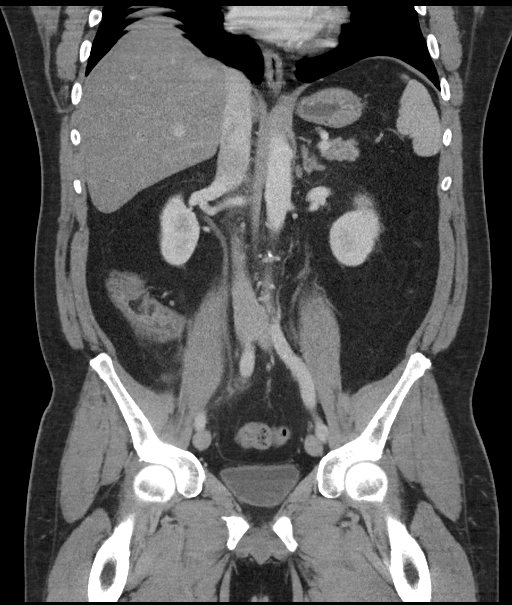

[16 of 46 positions shown; findings below may reference images not displayed]

FINDINGS: Lower chest: Lung bases clear

Hepatobiliary: Post cholecystectomy. Minimal fatty infiltration of
liver. No focal hepatic lesions.

Pancreas: Normal appearance

Spleen: Normal appearance.  Tiny splenule inferior to spleen.

Adrenals/Urinary Tract: Adrenal glands normal appearance. Small
nonobstructing calculus at inferior pole RIGHT kidney. Kidneys,
ureters, and bladder normal appearance.

Stomach/Bowel: Enlarged thickened appendix with periappendiceal
inflammatory changes distally consistent with acute appendicitis.
Small focal fluid collection adjacent to distal appendix 17 x 13 x
15 mm suspect developing abscess. Stomach and bowel loops otherwise
normal appearance for technique.

Vascular/Lymphatic: Minimal atherosclerotic calcification aorta and
iliac arteries. No adenopathy.

Reproductive: Minimal prostatic enlargement. Seminal vesicles
unremarkable.

Other: No free air or significant ascites.  No hernia.

Musculoskeletal: Unremarkable
IMPRESSION: Acute appendicitis with significant periappendiceal inflammatory
changes at the distal appendix and question small developing
periappendiceal abscess 17 x 13 x 15 mm in size.

## 2019-02-27 ENCOUNTER — Other Ambulatory Visit: Payer: Self-pay | Admitting: Podiatry

## 2019-02-27 ENCOUNTER — Other Ambulatory Visit: Payer: Self-pay

## 2019-02-27 ENCOUNTER — Encounter: Payer: Self-pay | Admitting: Podiatry

## 2019-02-27 ENCOUNTER — Ambulatory Visit (INDEPENDENT_AMBULATORY_CARE_PROVIDER_SITE_OTHER): Payer: 59

## 2019-02-27 ENCOUNTER — Ambulatory Visit: Payer: 59 | Admitting: Podiatry

## 2019-02-27 VITALS — BP 128/77 | HR 66 | Resp 16

## 2019-02-27 DIAGNOSIS — M76822 Posterior tibial tendinitis, left leg: Secondary | ICD-10-CM | POA: Diagnosis not present

## 2019-02-27 DIAGNOSIS — M7520 Bicipital tendinitis, unspecified shoulder: Secondary | ICD-10-CM | POA: Insufficient documentation

## 2019-02-27 DIAGNOSIS — M7750 Other enthesopathy of unspecified foot: Secondary | ICD-10-CM

## 2019-02-27 DIAGNOSIS — M722 Plantar fascial fibromatosis: Secondary | ICD-10-CM

## 2019-02-27 MED ORDER — METHYLPREDNISOLONE 4 MG PO TBPK: ORAL_TABLET | ORAL | 0 refills | Status: DC

## 2019-02-27 MED ORDER — MELOXICAM 15 MG PO TABS: 15.0000 mg | ORAL_TABLET | Freq: Every day | ORAL | 3 refills | Status: AC

## 2019-02-27 NOTE — Patient Instructions (Signed)

## 2019-02-27 NOTE — Progress Notes (Signed)
Subjective:  Patient ID: Tyrone Madden, male    DOB: 03-May-1965,  MRN: [id number] HPI Chief Complaint  Patient presents with  . Ankle Pain    Medial ankle left - swelling, aching x 6 weeks, in August though he had injured foot while [place] the [place], foot swelled up with arch pain, that got better, no treatment  . New Patient (Initial Visit)    53 y.o. male presents with the above complaint.   ROS: Denies fever chills nausea vomiting muscle aches pains calf pain back pain chest pain shortness of breath.  Past Medical History:  Diagnosis Date  . Appendicitis, acute   . Blood glucose elevated 05/30/2015  . BPH (benign prostatic hyperplasia)   . Cancer [facility])    testicular cancer with surg and chemo tx  . Decreased testosterone level 05/30/2015  . Elevated alanine aminotransferase (ALT) level 03/26/2015  . Elevated hemoglobin ([place])   . Gout   . History of testicular cancer   . Hypogonadism in male   . Over weight   . Pure hypercholesterolemia 05/30/2015  . Ruptured appendicitis 01/14/2017   Past Surgical History:  Procedure Laterality Date  . CHOLECYSTECTOMY    . GALLBLADDER SURGERY    . LAPAROSCOPIC APPENDECTOMY N/A 01/13/2017   Procedure: APPENDECTOMY LAPAROSCOPIC;  Surgeon: Clayburn Pert, MD;  Location: [facility] ORS;  Service: General;  Laterality: N/A;  . orchiectomy unilateral      Current Outpatient Medications:  .  meloxicam (MOBIC) 15 MG tablet, Take 1 tablet (15 mg total) by mouth daily., Disp: 30 tablet, Rfl: 3 .  methylPREDNISolone (MEDROL DOSEPAK) 4 MG TBPK tablet, 6 day dose pack - take as directed, Disp: 21 tablet, Rfl: 0  Current Facility-Administered Medications:  .  Testosterone PLLT 75 mg, 75 mg, Implant, Once, McGowan, Shannon A, PA-C .  Testosterone PLLT 75 mg, 75 mg, Implant, Once, McGowan, Shannon A, PA-C  No Known Allergies Review of Systems Objective:   Vitals:   02/27/19 0826  BP: 128/77  Pulse: 66  Resp: 16    General: Well  developed, nourished, in no acute distress, alert and oriented x3   Dermatological: Skin is warm, dry and supple bilateral. Nails x 10 are well maintained; remaining integument appears unremarkable at this time. There are no open sores, no preulcerative lesions, no rash or signs of infection present.  Vascular: Dorsalis Pedis artery and Posterior Tibial artery pedal pulses are 2/4 bilateral with immedate capillary fill time. Pedal hair growth present. No varicosities and no lower extremity edema present bilateral.   Neruologic: Grossly intact via light touch bilateral. Vibratory intact via tuning fork bilateral. Protective threshold with Semmes Wienstein monofilament intact to all pedal sites bilateral. Patellar and Achilles deep tendon reflexes 2+ bilateral. No Babinski or clonus noted bilateral.   Musculoskeletal: No gross boney pedal deformities bilateral. No pain, crepitus, or limitation noted with foot and ankle range of motion bilateral. Muscular strength 5/5 in all groups tested bilateral.  Gait: Unassisted, Nonantalgic.    Radiographs:  Radiographs taken today do not demonstrate any type of osseous abnormalities.  No acute findings.  Soft tissue swelling along the posterior tibial tendon and at the plantar fashion calcaneal insertion site.  Assessment & Plan:   Assessment: Plantar fasciitis.  Posterior tibial tendinitis compensatory.  Plan: Discussed etiology pathology conservative or surgical therapies at this point start him on a Medrol Dosepak to be followed by meloxicam.  Injected his left heel with 20 mg Kenalog 5 mg Marcaine point of maximal tenderness.  Tolerated procedure well.  Also started him with a plantar fascial brace to be followed by a night splint discussed appropriate shoe gear stretching exercise ice therapy shoe gear modifications.  We will follow-up with him in 1 month if not improved may consider MRI.      T. [place], [place]

## 2019-03-29 ENCOUNTER — Ambulatory Visit: Payer: 59 | Admitting: Podiatry

## 2019-04-19 ENCOUNTER — Ambulatory Visit (INDEPENDENT_AMBULATORY_CARE_PROVIDER_SITE_OTHER): Payer: 59 | Admitting: Podiatry

## 2019-04-19 ENCOUNTER — Encounter: Payer: Self-pay | Admitting: Podiatry

## 2019-04-19 ENCOUNTER — Other Ambulatory Visit: Payer: Self-pay

## 2019-04-19 DIAGNOSIS — M76822 Posterior tibial tendinitis, left leg: Secondary | ICD-10-CM | POA: Diagnosis not present

## 2019-04-19 DIAGNOSIS — M722 Plantar fascial fibromatosis: Secondary | ICD-10-CM

## 2019-04-19 NOTE — Progress Notes (Signed)
He presents today for follow-up of his plantar fasciitis and posterior tibial tendinitis left foot.  He states that is really not much different than it was last time I come in.  States that he cannot wear his night splint.  And has not been taking his meloxicam regularly.  Objective: Vital signs are stable he is alert and oriented x3 he has no pain on palpation medial calcaneal tubercle of the left heel.  Much decrease in tenderness on palpation of the posterior tibial tendon that there is still some fluctuance along the tendon sheath.  Assessment: Plantar fasciitis is resolving posterior tibial tendinitis is resolving.  Plan: Recommended that he start back wearing the plantar fascial brace removing the dorsiflexor a wedge from the unit.  Also that he should start back with the meloxicam on a regular basis.  We will follow-up with him in 1 month if not improved consider MRI orthotics.

## 2019-05-17 ENCOUNTER — Ambulatory Visit: Payer: 59 | Admitting: Podiatry

## 2020-08-21 ENCOUNTER — Ambulatory Visit (INDEPENDENT_AMBULATORY_CARE_PROVIDER_SITE_OTHER): Payer: 59

## 2020-08-21 ENCOUNTER — Encounter: Payer: Self-pay | Admitting: Podiatry

## 2020-08-21 ENCOUNTER — Other Ambulatory Visit: Payer: Self-pay

## 2020-08-21 ENCOUNTER — Ambulatory Visit: Payer: 59 | Admitting: Podiatry

## 2020-08-21 DIAGNOSIS — M2041 Other hammer toe(s) (acquired), right foot: Secondary | ICD-10-CM | POA: Diagnosis not present

## 2020-08-21 NOTE — Progress Notes (Signed)
He presents today chief complaint of his fourth toe on his right foot tucked under his third toe.  He says it feels like I am walking on the fourth toe all the time.  He is is aggravating.  Causing discomfort in the callus line.  Objective: Vital signs are stable he is alert oriented x3 pulses are palpable.  There is no erythema edema cellulitis drainage or odor he has adductovarus rotated fourth toes bilaterally right seems to be worse than the left there is a callused ridge from where he is walking on it with the third toe.  Radiographs taken today demonstrate a rectus toe does not appear to be curving other than just a slight angulation of the distal aspect of the proximal phalanx at the head.  Assessment: Adductovarus rotated hammertoe deformities fourth bilateral.  Plan: Placed him in silicone toe shields.  I will follow-up with him in a few weeks to discuss surgical intervention if necessary.

## 2020-11-04 ENCOUNTER — Ambulatory Visit: Payer: 59 | Admitting: Podiatry

## 2021-07-01 ENCOUNTER — Other Ambulatory Visit (HOSPITAL_COMMUNITY): Payer: Self-pay | Admitting: Gastroenterology

## 2021-07-01 ENCOUNTER — Other Ambulatory Visit: Payer: Self-pay | Admitting: Gastroenterology

## 2021-07-01 DIAGNOSIS — K760 Fatty (change of) liver, not elsewhere classified: Secondary | ICD-10-CM

## 2021-07-14 ENCOUNTER — Other Ambulatory Visit: Payer: 59

## 2021-07-17 ENCOUNTER — Ambulatory Visit
Admission: RE | Admit: 2021-07-17 | Discharge: 2021-07-17 | Disposition: A | Discharge status: Home / Self Care | Payer: 59 | Source: Ambulatory Visit | Attending: Gastroenterology | Admitting: Gastroenterology

## 2021-07-17 ENCOUNTER — Other Ambulatory Visit: Payer: 59

## 2021-07-17 DIAGNOSIS — K760 Fatty (change of) liver, not elsewhere classified: Secondary | ICD-10-CM | POA: Diagnosis not present

## 2021-10-09 ENCOUNTER — Encounter: Payer: Self-pay | Admitting: Gastroenterology

## 2021-10-09 NOTE — H&P (Signed)
Pre-Procedure H&P   Patient ID: Tyrone Madden is a 56 y.o. male.  Gastroenterology Provider: Annamaria Helling, DO  PCP: Derinda Late, MD  Date: 10/10/2021  HPI Tyrone Madden is a 56 y.o. male who presents today for Colonoscopy for Colorectal cancer screening; family history of polyps-mother. Patient undergoing screening given family history of colon polyps as above.  No family history of colon cancer.  His last colonoscopy was in 2018 which only demonstrated sigmoid diverticulosis otherwise negative.  Also had a negative colonoscopy in 2010. Patient currently with normal bowel movements without melena hematochezia diarrhea or constipation. CT in 2019 demonstrating some fatty liver disease Most recent lab work A1c 5.5 hemoglobin 16.4 MCV 91 platelets 117,000 total bili 1.2 ALT 41 AST 24 AP 67 The patient is status post appendectomy and cholecystectomy.  He has history of testicular cancer status post orchiectomy and systemic chemotherapy   Past Medical History:  Diagnosis Date   Appendicitis, acute    Blood glucose elevated 05/30/2015   BPH (benign prostatic hyperplasia)    Cancer ([facility])    testicular cancer with surg and chemo tx   Decreased testosterone level 05/30/2015   Elevated alanine aminotransferase (ALT) level 03/26/2015   Elevated hemoglobin ([facility])    Gout    History of testicular cancer    Hypogonadism in male    Over weight    Pure hypercholesterolemia 05/30/2015   Ruptured appendicitis 01/14/2017    Past Surgical History:  Procedure Laterality Date   CHOLECYSTECTOMY     GALLBLADDER SURGERY     LAPAROSCOPIC APPENDECTOMY N/A 01/13/2017   Procedure: APPENDECTOMY LAPAROSCOPIC;  Surgeon: Clayburn Pert, MD;  Location: [facility] ORS;  Service: General;  Laterality: N/A;   orchiectomy unilateral      Family History Mother-history of colon polyps No other h/o GI disease or malignancy  Review of Systems  Constitutional:  Negative for activity change,  appetite change, chills, diaphoresis, fatigue, fever and unexpected weight change.  HENT:  Negative for trouble swallowing and voice change.   Respiratory:  Negative for shortness of breath and wheezing.   Cardiovascular:  Negative for chest pain, palpitations and leg swelling.  Gastrointestinal:  Negative for abdominal distention, abdominal pain, anal bleeding, blood in stool, constipation, diarrhea, nausea and vomiting.  Genitourinary:  Positive for flank pain.  Musculoskeletal:  Positive for back pain. Negative for arthralgias and myalgias.  Skin:  Negative for color change and pallor.  Neurological:  Negative for dizziness, syncope and weakness.  Psychiatric/Behavioral:  Negative for confusion. The patient is not nervous/anxious.   All other systems reviewed and are negative.    Medications No current facility-administered medications on file prior to encounter.   Current Outpatient Medications on File Prior to Encounter  Medication Sig Dispense Refill   loratadine (CLARITIN) 10 MG tablet Take 10 mg by mouth daily.     meloxicam (MOBIC) 15 MG tablet Take 1 tablet (15 mg total) by mouth daily. 30 tablet 3    Pertinent medications related to GI and procedure were reviewed by me with the patient prior to the procedure   Current Facility-Administered Medications:    0.9 %  sodium chloride infusion, , Intravenous, Continuous, Annamaria Helling, DO, Last Rate: 20 mL/hr at 10/10/21 0744, New Bag at 10/10/21 0744  sodium chloride 20 mL/hr at 10/10/21 0744       No Known Allergies Allergies were reviewed by me prior to the procedure  Objective   Body mass index is 32.54 kg/m. Vitals:  10/09/21 1351 10/10/21 0730  BP:  125/89  Pulse:  (!) 55  Resp:  16  Temp:  (!) 96.2 F (35.7 C)  TempSrc:  Temporal  SpO2:  100%  Weight: 103 kg 97.1 kg  Height:  '5\' 8"'$  (1.727 m)     Physical Exam Vitals and nursing note reviewed.  Constitutional:      General: He is not in acute  distress.    Appearance: Normal appearance. He is obese. He is not ill-appearing, toxic-appearing or diaphoretic.  HENT:     Head: Normocephalic and atraumatic.     Nose: Nose normal.     Mouth/Throat:     Mouth: Mucous membranes are moist.     Pharynx: Oropharynx is clear.  Eyes:     General: No scleral icterus.    Extraocular Movements: Extraocular movements intact.  Cardiovascular:     Rate and Rhythm: Regular rhythm. Bradycardia present.     Heart sounds: Normal heart sounds. No murmur heard.    No friction rub. No gallop.  Pulmonary:     Effort: Pulmonary effort is normal. No respiratory distress.     Breath sounds: Normal breath sounds. No wheezing, rhonchi or rales.  Abdominal:     General: Bowel sounds are normal. There is no distension.     Palpations: Abdomen is soft.     Tenderness: There is no abdominal tenderness. There is no guarding or rebound.  Musculoskeletal:     Cervical back: Neck supple.     Right lower leg: No edema.     Left lower leg: No edema.  Skin:    General: Skin is warm and dry.     Coloration: Skin is not jaundiced or pale.  Neurological:     General: No focal deficit present.     Mental Status: He is alert and oriented to person, place, and time. Mental status is at baseline.  Psychiatric:        Mood and Affect: Mood normal.        Behavior: Behavior normal.        Thought Content: Thought content normal.        Judgment: Judgment normal.      Assessment:  Tyrone Madden is a 56 y.o. male  who presents today for Colonoscopy for Colorectal cancer screening; family history of polyps-mother.  Plan:  Colonoscopy with possible intervention today  Colonoscopy with possible biopsy, control of bleeding, polypectomy, and interventions as necessary has been discussed with the patient/patient representative. Informed consent was obtained from the patient/patient representative after explaining the indication, nature, and risks of the procedure  including but not limited to death, bleeding, perforation, missed neoplasm/lesions, cardiorespiratory compromise, and reaction to medications. Opportunity for questions was given and appropriate answers were provided. Patient/patient representative has verbalized understanding is amenable to undergoing the procedure.   Annamaria Helling, DO  [facility] Gastroenterology  Portions of the record may have been created with voice recognition software. Occasional wrong-word or 'sound-a-like' substitutions may have occurred due to the inherent limitations of voice recognition software.  Read the chart carefully and recognize, using context, where substitutions may have occurred.

## 2021-10-10 ENCOUNTER — Ambulatory Visit: Payer: 59 | Admitting: Certified Registered"

## 2021-10-10 ENCOUNTER — Ambulatory Visit
Admission: RE | Admit: 2021-10-10 | Discharge: 2021-10-10 | Disposition: A | Discharge status: Home / Self Care | Payer: 59 | Attending: Gastroenterology | Admitting: Gastroenterology

## 2021-10-10 ENCOUNTER — Encounter: Payer: Self-pay | Admitting: Gastroenterology

## 2021-10-10 ENCOUNTER — Encounter
Admission: RE | Disposition: A | Discharge status: Home / Self Care | Payer: Self-pay | Source: Home / Self Care | Attending: Gastroenterology

## 2021-10-10 DIAGNOSIS — K760 Fatty (change of) liver, not elsewhere classified: Secondary | ICD-10-CM | POA: Diagnosis not present

## 2021-10-10 DIAGNOSIS — Z9049 Acquired absence of other specified parts of digestive tract: Secondary | ICD-10-CM | POA: Diagnosis not present

## 2021-10-10 DIAGNOSIS — Z8371 Family history of colonic polyps: Secondary | ICD-10-CM | POA: Insufficient documentation

## 2021-10-10 DIAGNOSIS — Z9079 Acquired absence of other genital organ(s): Secondary | ICD-10-CM | POA: Diagnosis not present

## 2021-10-10 DIAGNOSIS — K5730 Diverticulosis of large intestine without perforation or abscess without bleeding: Secondary | ICD-10-CM | POA: Insufficient documentation

## 2021-10-10 DIAGNOSIS — K640 First degree hemorrhoids: Secondary | ICD-10-CM | POA: Diagnosis not present

## 2021-10-10 DIAGNOSIS — Z8547 Personal history of malignant neoplasm of testis: Secondary | ICD-10-CM | POA: Insufficient documentation

## 2021-10-10 DIAGNOSIS — Z1211 Encounter for screening for malignant neoplasm of colon: Secondary | ICD-10-CM | POA: Diagnosis not present

## 2021-10-10 DIAGNOSIS — Z9221 Personal history of antineoplastic chemotherapy: Secondary | ICD-10-CM | POA: Insufficient documentation

## 2021-10-10 HISTORY — PX: COLONOSCOPY: SHX5424

## 2021-10-10 SURGERY — COLONOSCOPY
Anesthesia: General

## 2021-10-10 MED ORDER — SODIUM CHLORIDE 0.9 % IV SOLN: INTRAVENOUS | Status: DC

## 2021-10-10 MED ORDER — GLYCOPYRROLATE PF 0.2 MG/ML IJ SOSY
PREFILLED_SYRINGE | INTRAMUSCULAR | Status: DC | PRN
  Administered 2021-10-10: .2 mg via INTRAVENOUS

## 2021-10-10 MED ORDER — PROPOFOL 10 MG/ML IV BOLUS
INTRAVENOUS | Status: AC
  Filled 2021-10-10: qty 20

## 2021-10-10 MED ORDER — PHENYLEPHRINE 80 MCG/ML (10ML) SYRINGE FOR IV PUSH (FOR BLOOD PRESSURE SUPPORT)
PREFILLED_SYRINGE | INTRAVENOUS | Status: AC
  Filled 2021-10-10: qty 10

## 2021-10-10 MED ORDER — PROPOFOL 10 MG/ML IV BOLUS
INTRAVENOUS | Status: AC
  Filled 2021-10-10: qty 40

## 2021-10-10 MED ORDER — GLYCOPYRROLATE 0.2 MG/ML IJ SOLN
INTRAMUSCULAR | Status: AC
  Filled 2021-10-10: qty 1

## 2021-10-10 MED ORDER — STERILE WATER FOR IRRIGATION IR SOLN
Status: DC | PRN
  Administered 2021-10-10: 60 mL

## 2021-10-10 MED ORDER — LIDOCAINE HCL (CARDIAC) PF 100 MG/5ML IV SOSY
PREFILLED_SYRINGE | INTRAVENOUS | Status: DC | PRN
  Administered 2021-10-10: 100 mg via INTRAVENOUS

## 2021-10-10 MED ORDER — LIDOCAINE HCL (PF) 2 % IJ SOLN
INTRAMUSCULAR | Status: AC
  Filled 2021-10-10: qty 5

## 2021-10-10 MED ORDER — PROPOFOL 10 MG/ML IV BOLUS
INTRAVENOUS | Status: DC | PRN
  Administered 2021-10-10: 140 ug/kg/min via INTRAVENOUS
  Administered 2021-10-10: 100 mg via INTRAVENOUS

## 2021-10-10 NOTE — Interval H&P Note (Signed)
History and Physical Interval Note: Preprocedure H&P from 10/10/21  was reviewed and there was no interval change after seeing and examining the patient.  Written consent was obtained from the patient after discussion of risks, benefits, and alternatives. Patient has consented to proceed with Colonoscopy with possible intervention   10/10/2021 8:29 AM  Tyrone Madden  has presented today for surgery, with the diagnosis of Family history of polyps in the colon (Z83.71) Diverticulosis of colon without hemorrhage (K57.30).  The various methods of treatment have been discussed with the patient and family. After consideration of risks, benefits and other options for treatment, the patient has consented to  Procedure(s): COLONOSCOPY (N/A) as a surgical intervention.  The patient's history has been reviewed, patient examined, no change in status, stable for surgery.  I have reviewed the patient's chart and labs.  Questions were answered to the patient's satisfaction.     Annamaria Helling

## 2021-10-10 NOTE — Op Note (Signed)
[facility] Gastroenterology Patient Name: Tyrone Madden Procedure Date: 10/10/2021 8:27 AM MRN: [id number] Account #: [id number] Date of Birth: [birth year] Admit Type: Outpatient Age: 56 Room: [facility] ENDO ROOM 1 Gender: Male Note Status: Finalized Instrument Name: Jasper Riling [id number] Procedure:             Colonoscopy Indications:           Colon cancer screening in patient at increased risk:                         Family history of 1st-degree relative with colon polyps Providers:             Annamaria Helling DO, DO Referring MD:          Caprice Renshaw MD (Referring MD) Medicines:             Monitored Anesthesia Care Complications:         No immediate complications. Estimated blood loss:                         Minimal. Procedure:             Pre-Anesthesia Assessment:                        - Prior to the procedure, a History and Physical was                         performed, and patient medications and allergies were                         reviewed. The patient is competent. The risks and                         benefits of the procedure and the sedation options and                         risks were discussed with the patient. All questions                         were answered and informed consent was obtained.                         Patient identification and proposed procedure were                         verified by the physician, the nurse, the anesthetist                         and the technician in the endoscopy suite. Mental                         Status Examination: alert and oriented. Airway                         Examination: normal oropharyngeal airway and neck                         mobility. Respiratory Examination: clear to  auscultation. CV Examination: RRR, no murmurs, no S3                         or S4. Prophylactic Antibiotics: The patient does not                         require prophylactic  antibiotics. Prior                         Anticoagulants: The patient has taken no previous                         anticoagulant or antiplatelet agents. ASA Grade                         Assessment: II - A patient with mild systemic disease.                         After reviewing the risks and benefits, the patient                         was deemed in satisfactory condition to undergo the                         procedure. The anesthesia plan was to use monitored                         anesthesia care (MAC). Immediately prior to                         administration of medications, the patient was                         re-assessed for adequacy to receive sedatives. The                         heart rate, respiratory rate, oxygen saturations,                         blood pressure, adequacy of pulmonary ventilation, and                         response to care were monitored throughout the                         procedure. The physical status of the patient was                         re-assessed after the procedure.                        After obtaining informed consent, the colonoscope was                         passed under direct vision. Throughout the procedure,                         the patient's blood pressure, pulse, and oxygen  saturations were monitored continuously. The                         Colonoscope was introduced through the anus and                         advanced to the the cecum, identified by appendiceal                         orifice and ileocecal valve. The colonoscopy was                         performed without difficulty. The patient tolerated                         the procedure well. The quality of the bowel                         preparation was evaluated using the BBPS [facility] Bowel                         Preparation Scale) with scores of: Right Colon = 3,                         Transverse Colon = 3 and Left Colon = 3  (entire mucosa                         seen well with no residual staining, small fragments                         of stool or opaque liquid). The total BBPS score                         equals 9. The ileocecal valve, appendiceal orifice,                         and rectum were photographed. Findings:      The perianal and digital rectal examinations were normal. Pertinent       negatives include normal sphincter tone.      A 1 to 2 mm polyp was found in the cecum. The polyp was sessile. The       polyp was removed with a jumbo cold forceps. Resection and retrieval       were complete. Estimated blood loss was minimal.      A few small-mouthed diverticula were found in the sigmoid colon.       Estimated blood loss: none.      Non-bleeding internal hemorrhoids were found during retroflexion. The       hemorrhoids were Grade I (internal hemorrhoids that do not prolapse).       Estimated blood loss: none.      The exam was otherwise without abnormality on direct and retroflexion       views. Impression:            - One 1 to 2 mm polyp in the cecum, removed with a                         jumbo cold  forceps. Resected and retrieved.                        - Diverticulosis in the sigmoid colon.                        - Non-bleeding internal hemorrhoids.                        - The examination was otherwise normal on direct and                         retroflexion views. Recommendation:        - Discharge patient to home.                        - Resume previous diet.                        - Continue present medications.                        - Await pathology results.                        - Repeat colonoscopy for surveillance based on                         pathology results.                        - Return to referring physician as previously                         scheduled.                        - Recommend evaluation for obstructive sleep apnea and                          restless leg syndrome                        - The findings and recommendations were discussed with                         the patient. Procedure Code(s):     --- Professional ---                        [id number], Colonoscopy, flexible; with biopsy, single or                         multiple Diagnosis Code(s):     --- Professional ---                        Z83.71, Family history of colonic polyps                        K63.5, Polyp of colon                        K64.0, First degree hemorrhoids  K57.30, Diverticulosis of large intestine without                         perforation or abscess without bleeding CPT copyright 2019 [employer]. All rights reserved. The codes documented in this report are preliminary and upon coder review may  be revised to meet current compliance requirements. Attending Participation:      I personally performed the entire procedure. Volney American, DO Annamaria Helling DO, DO 10/10/2021 9:04:38 AM This report has been signed electronically. Number of Addenda: 0 Note Initiated On: 10/10/2021 8:27 AM Scope Withdrawal Time: 0 hours 13 minutes 39 seconds  Total Procedure Duration: 0 hours 17 minutes 29 seconds  Estimated Blood Loss:  Estimated blood loss was minimal.      [facility]

## 2021-10-10 NOTE — Anesthesia Preprocedure Evaluation (Signed)
Anesthesia Evaluation  Patient identified by MRN, date of birth, ID band Patient awake    Reviewed: Allergy & Precautions, NPO status , Patient's Chart, lab work & pertinent test results  Airway Mallampati: III  TM Distance: >3 FB Neck ROM: Full    Dental  (+) Teeth Intact   Pulmonary neg pulmonary ROS,    breath sounds clear to auscultation       Cardiovascular Exercise Tolerance: Good  Rhythm:Regular     Neuro/Psych negative neurological ROS  negative psych ROS   GI/Hepatic negative GI ROS, Neg liver ROS,   Endo/Other  negative endocrine ROS  Renal/GU negative Renal ROS  negative genitourinary   Musculoskeletal   Abdominal (+) + obese,   Peds negative pediatric ROS (+)  Hematology negative hematology ROS (+)   Anesthesia Other Findings   Reproductive/Obstetrics                             Anesthesia Physical Anesthesia Plan  ASA: 2  Anesthesia Plan: General   Post-op Pain Management:    Induction: Intravenous  PONV Risk Score and Plan:   Airway Management Planned: Natural Airway  Additional Equipment:   Intra-op Plan:   Post-operative Plan:   Informed Consent: I have reviewed the patients History and Physical, chart, labs and discussed the procedure including the risks, benefits and alternatives for the proposed anesthesia with the patient or authorized representative who has indicated his/her understanding and acceptance.       Plan Discussed with: CRNA and Surgeon  Anesthesia Plan Comments:         Anesthesia Quick Evaluation

## 2021-10-10 NOTE — Anesthesia Postprocedure Evaluation (Signed)
Anesthesia Post Note  Patient: Tyrone Madden  Procedure(s) Performed: COLONOSCOPY  Patient location during evaluation: PACU Anesthesia Type: General Level of consciousness: awake and awake and alert Pain management: pain level controlled Vital Signs Assessment: post-procedure vital signs reviewed and stable Respiratory status: nonlabored ventilation and spontaneous breathing Cardiovascular status: stable Anesthetic complications: no   No notable events documented.   Last Vitals:  Vitals:   10/10/21 0730 10/10/21 0903  BP: 125/89 100/68  Pulse: (!) 55   Resp: 16   Temp: (!) 35.7 C (!) 35.6 C  SpO2: 100%     Last Pain:  Vitals:   10/10/21 0913  TempSrc:   PainSc: 0-No pain                 VAN STAVEREN,Rowyn Spilde

## 2021-10-10 NOTE — Transfer of Care (Signed)
Immediate Anesthesia Transfer of Care Note  Patient: Tyrone Madden  Procedure(s) Performed: COLONOSCOPY  Patient Location: PACU  Anesthesia Type:General  Level of Consciousness: drowsy  Airway & Oxygen Therapy: Patient Spontanous Breathing  Post-op Assessment: Report given to RN and Post -op Vital signs reviewed and stable  Post vital signs: Reviewed and stable  Last Vitals:  Vitals Value Taken Time  BP 100/68 10/10/21 0905  Temp 35.6 C 10/10/21 0903  Pulse 60 10/10/21 0905  Resp 14 10/10/21 0906  SpO2 98 % 10/10/21 0905  Vitals shown include unvalidated device data.  Last Pain:  Vitals:   10/10/21 0903  TempSrc: Temporal  PainSc: Asleep         Complications: No notable events documented.

## 2021-10-10 NOTE — Interval H&P Note (Signed)
History and Physical Interval Note: Preprocedure H&P from 10/10/21  was reviewed and there was no interval change after seeing and examining the patient.  Written consent was obtained from the patient after discussion of risks, benefits, and alternatives. Patient has consented to proceed with Colonoscopy with possible intervention   10/10/2021 8:29 AM  Jeri Lager  has presented today for surgery, with the diagnosis of Family history of polyps in the colon (Z83.71) Diverticulosis of colon without hemorrhage (K57.30).  The various methods of treatment have been discussed with the patient and family. After consideration of risks, benefits and other options for treatment, the patient has consented to  Procedure(s): COLONOSCOPY (N/A) as a surgical intervention.  The patient's history has been reviewed, patient examined, no change in status, stable for surgery.  I have reviewed the patient's chart and labs.  Questions were answered to the patient's satisfaction.     Annamaria Helling

## 2021-10-13 LAB — SURGICAL PATHOLOGY

## 2022-06-19 ENCOUNTER — Encounter: Payer: Self-pay | Admitting: Emergency Medicine

## 2022-06-19 ENCOUNTER — Emergency Department: Payer: 59

## 2022-06-19 ENCOUNTER — Emergency Department
Admission: EM | Admit: 2022-06-19 | Discharge: 2022-06-19 | Disposition: A | Discharge status: Home / Self Care | Payer: 59 | Attending: Emergency Medicine | Admitting: Emergency Medicine

## 2022-06-19 DIAGNOSIS — Z8547 Personal history of malignant neoplasm of testis: Secondary | ICD-10-CM | POA: Insufficient documentation

## 2022-06-19 DIAGNOSIS — R109 Unspecified abdominal pain: Secondary | ICD-10-CM | POA: Diagnosis present

## 2022-06-19 DIAGNOSIS — N23 Unspecified renal colic: Secondary | ICD-10-CM

## 2022-06-19 DIAGNOSIS — N132 Hydronephrosis with renal and ureteral calculous obstruction: Secondary | ICD-10-CM | POA: Insufficient documentation

## 2022-06-19 LAB — CBC WITH DIFFERENTIAL/PLATELET
Abs Immature Granulocytes: 0.03 10*3/uL (ref 0.00–0.07)
Basophils Absolute: 0.1 10*3/uL (ref 0.0–0.1)
Basophils Relative: 1 %
Eosinophils Absolute: 0.1 10*3/uL (ref 0.0–0.5)
Eosinophils Relative: 1 %
HCT: 45.5 % (ref 39.0–52.0)
Hemoglobin: 15.6 g/dL (ref 13.0–17.0)
Immature Granulocytes: 0 %
Lymphocytes Relative: 23 %
Lymphs Abs: 1.9 10*3/uL (ref 0.7–4.0)
MCH: 31.1 pg (ref 26.0–34.0)
MCHC: 34.3 g/dL (ref 30.0–36.0)
MCV: 90.8 fL (ref 80.0–100.0)
Monocytes Absolute: 0.8 10*3/uL (ref 0.1–1.0)
Monocytes Relative: 10 %
Neutro Abs: 5.2 10*3/uL (ref 1.7–7.7)
Neutrophils Relative %: 65 %
Platelets: 202 10*3/uL (ref 150–400)
RBC: 5.01 MIL/uL (ref 4.22–5.81)
RDW: 12.5 % (ref 11.5–15.5)
WBC: 8.1 10*3/uL (ref 4.0–10.5)
nRBC: 0 % (ref 0.0–0.2)

## 2022-06-19 LAB — COMPREHENSIVE METABOLIC PANEL
ALT: 46 U/L — ABNORMAL HIGH (ref 0–44)
AST: 39 U/L (ref 15–41)
Albumin: 4.4 g/dL (ref 3.5–5.0)
Alkaline Phosphatase: 57 U/L (ref 38–126)
Anion gap: 15 (ref 5–15)
BUN: 15 mg/dL (ref 6–20)
CO2: 23 mmol/L (ref 22–32)
Calcium: 9.5 mg/dL (ref 8.9–10.3)
Chloride: 100 mmol/L (ref 98–111)
Creatinine, Ser: 1.29 mg/dL — ABNORMAL HIGH (ref 0.61–1.24)
GFR, Estimated: >60 mL/min (ref >60)
Glucose, Bld: 140 mg/dL — ABNORMAL HIGH (ref 70–99)
Potassium: 3.9 mmol/L (ref 3.5–5.1)
Sodium: 138 mmol/L (ref 135–145)
Total Bilirubin: 1.5 mg/dL — ABNORMAL HIGH (ref 0.3–1.2)
Total Protein: 7.8 g/dL (ref 6.5–8.1)

## 2022-06-19 LAB — URINALYSIS, ROUTINE W REFLEX MICROSCOPIC
Bacteria, UA: NONE SEEN
Bilirubin Urine: NEGATIVE
Glucose, UA: NEGATIVE mg/dL
Ketones, ur: 5 mg/dL — AB
Leukocytes,Ua: NEGATIVE
Nitrite: NEGATIVE
Protein, ur: NEGATIVE mg/dL
Specific Gravity, Urine: 1.012 (ref 1.005–1.030)
Squamous Epithelial / HPF: NONE SEEN /HPF (ref 0–5)
pH: 7 (ref 5.0–8.0)

## 2022-06-19 MED ORDER — TAMSULOSIN HCL 0.4 MG PO CAPS: 0.4000 mg | ORAL_CAPSULE | Freq: Every day | ORAL | 0 refills | Status: AC

## 2022-06-19 MED ORDER — KETOROLAC TROMETHAMINE 30 MG/ML IJ SOLN
15.0000 mg | Freq: Once | INTRAMUSCULAR | Status: AC
  Administered 2022-06-19: 15 mg via INTRAVENOUS
  Filled 2022-06-19: qty 1

## 2022-06-19 MED ORDER — ONDANSETRON 4 MG PO TBDP: 4.0000 mg | ORAL_TABLET | Freq: Three times a day (TID) | ORAL | 0 refills | Status: AC | PRN

## 2022-06-19 MED ORDER — TAMSULOSIN HCL 0.4 MG PO CAPS
0.4000 mg | ORAL_CAPSULE | Freq: Once | ORAL | Status: AC
  Administered 2022-06-19: 0.4 mg via ORAL
  Filled 2022-06-19: qty 1

## 2022-06-19 MED ORDER — ONDANSETRON HCL 4 MG/2ML IJ SOLN
4.0000 mg | Freq: Once | INTRAMUSCULAR | Status: AC
  Administered 2022-06-19: 4 mg via INTRAVENOUS
  Filled 2022-06-19: qty 2

## 2022-06-19 MED ORDER — HYDROMORPHONE HCL 1 MG/ML IJ SOLN
0.5000 mg | Freq: Once | INTRAMUSCULAR | Status: AC
  Administered 2022-06-19: 0.5 mg via INTRAVENOUS
  Filled 2022-06-19: qty 0.5

## 2022-06-19 MED ORDER — SODIUM CHLORIDE 0.9 % IV BOLUS
1000.0000 mL | Freq: Once | INTRAVENOUS | Status: AC
  Administered 2022-06-19: 1000 mL via INTRAVENOUS

## 2022-06-19 MED ORDER — OXYCODONE-ACETAMINOPHEN 5-325 MG PO TABS: 1.0000 | ORAL_TABLET | ORAL | 0 refills | Status: AC | PRN

## 2022-06-19 NOTE — ED Notes (Signed)
Pt verbalized understanding of DC instructions. Signing pad did not work.   

## 2022-06-19 NOTE — ED Triage Notes (Signed)
Pt presents ambulatory to triage via POV with complaints of L sided flank pain that started 3 hours ago. Pt endorses some burning with urination as well. No meds taken PTA. A&Ox4 at this time. Denies V/D, CP or SOB.

## 2022-06-19 NOTE — ED Provider Notes (Signed)
[facility] Provider Note    Event Date/Time   First MD Initiated Contact with Patient 06/19/22 [phone]     (approximate)   History   Flank Pain   HPI  Tyrone Madden is a 57 y.o. male who presents to the ED from home with a chief complaint of left flank pain which began suddenly 3 hours ago.  Endorses dysuria and nausea.  Denies fever/chills, chest pain, shortness of breath, vomiting, testicular pain or swelling.  No prior history of kidney stones.     Past Medical History   Past Medical History:  Diagnosis Date   Appendicitis, acute    Blood glucose elevated 05/30/2015   BPH (benign prostatic hyperplasia)    Cancer (HCC)    testicular cancer with surg and chemo tx   Decreased testosterone level 05/30/2015   Elevated alanine aminotransferase (ALT) level 03/26/2015   Elevated hemoglobin (HCC)    Gout    History of testicular cancer    Hypogonadism in male    Over weight    Pure hypercholesterolemia 05/30/2015   Ruptured appendicitis 01/14/2017     Active Problem List   Patient Active Problem List   Diagnosis Date Noted   Bicipital tenosynovitis 02/27/2019   Ruptured appendicitis 01/14/2017   Appendicitis, acute    Blood glucose elevated 05/30/2015   Gout 05/30/2015   Decreased testosterone level 05/30/2015   Pure hypercholesterolemia 05/30/2015   Elevated alanine aminotransferase (ALT) level 03/26/2015   Hypogonadism in male 11/06/2014   BPH (benign prostatic hyperplasia) 11/06/2014   History of testicular cancer 11/06/2014     Past Surgical History   Past Surgical History:  Procedure Laterality Date   CHOLECYSTECTOMY     COLONOSCOPY N/A 10/10/2021   Procedure: COLONOSCOPY;  Surgeon: Annamaria Helling, DO;  Location: [place];  Service: Gastroenterology;  Laterality: N/A;   GALLBLADDER SURGERY     LAPAROSCOPIC APPENDECTOMY N/A 01/13/2017   Procedure: APPENDECTOMY LAPAROSCOPIC;  Surgeon: Clayburn Pert, MD;  Location:  [facility];  Service: General;  Laterality: N/A;   orchiectomy unilateral       Home Medications   Prior to Admission medications   Medication Sig Start Date End Date Taking? Authorizing Provider  ondansetron (ZOFRAN-ODT) 4 MG disintegrating tablet Take 1 tablet (4 mg total) by mouth every 8 (eight) hours as needed for nausea or vomiting. 06/19/22  Yes Paulette Blanch, MD  oxyCODONE-acetaminophen (PERCOCET/ROXICET) 5-325 MG tablet Take 1 tablet by mouth every 4 (four) hours as needed for severe pain. 06/19/22  Yes Paulette Blanch, MD  tamsulosin (FLOMAX) 0.4 MG CAPS capsule Take 1 capsule (0.4 mg total) by mouth daily. 06/19/22  Yes Paulette Blanch, MD  loratadine (CLARITIN) 10 MG tablet Take 10 mg by mouth daily.    [provider]  meloxicam (MOBIC) 15 MG tablet Take 1 tablet (15 mg total) by mouth daily. 02/27/19   Hyatt, Max T, DPM     Allergies  Patient has no known allergies.   Family History   Family History  Problem Relation Age of Onset   Heart disease Unknown    Prostate cancer Neg Hx    Bladder Cancer Neg Hx    Kidney disease Neg Hx    Kidney cancer Neg Hx      Physical Exam  Triage Vital Signs: ED Triage Vitals  Enc Vitals Group     BP      Pulse      Resp  Temp      Temp src      SpO2      Weight      Height      Head Circumference      Peak Flow      Pain Score      Pain Loc      Pain Edu?      Excl. in [place]?     Updated Vital Signs: BP (!) 144/85   Pulse 60   Temp 97.8 F (36.6 C) (Oral)   Resp 18   Ht 5\' 8"  (1.727 m)   Wt 98.3 kg   SpO2 98%   BMI 32.95 kg/m    General: Awake, mild distress.  CV:  RRR.  Good peripheral perfusion.  Resp:  Normal effort.  CTAB. Abd:  Nontender.  Mild left CTAB.  No distention.  Other:  No truncal vesicles.   ED Results / Procedures / Treatments  Labs (all labs ordered are listed, but only abnormal results are displayed) Labs Reviewed  COMPREHENSIVE METABOLIC PANEL - Abnormal; Notable for the  following components:      Result Value   Glucose, Bld 140 (*)    Creatinine, Ser 1.29 (*)    ALT 46 (*)    Total Bilirubin 1.5 (*)    All other components within normal limits  URINALYSIS, ROUTINE W REFLEX MICROSCOPIC - Abnormal; Notable for the following components:   Color, Urine YELLOW (*)    APPearance CLEAR (*)    Hgb urine dipstick MODERATE (*)    Ketones, ur 5 (*)    All other components within normal limits  CBC WITH DIFFERENTIAL/PLATELET     EKG  None   RADIOLOGY I have independently visualized and interpreted patient's CT scan as well as noted the radiology interpretation:  CT renal colic: 4 mm left UVJ stone  Official radiology report(s): CT Renal Stone Study  Result Date: 06/19/2022 CLINICAL DATA:  Abdominal/flank pain with stone suspected EXAM: CT ABDOMEN AND PELVIS WITHOUT CONTRAST TECHNIQUE: Multidetector CT imaging of the abdomen and pelvis was performed following the standard protocol without IV contrast. RADIATION DOSE REDUCTION: This exam was performed according to the departmental dose-optimization program which includes automated exposure control, adjustment of the mA and/or kV according to patient size and/or use of iterative reconstruction technique. COMPARISON:  01/12/2018 FINDINGS: Lower chest:  No contributory findings. Hepatobiliary: Steatosis of the liver.Cholecystectomy. No bile duct dilatation Pancreas: Unremarkable. Spleen: Unremarkable. Adrenals/Urinary Tract: Negative adrenals. Mild left hydroureteronephrosis with low-density left renal expansion and perinephric stranding. 4 mm stone at the left UVJ. 3 mm calculi in the upper and lower pole right kidney. Unremarkable bladder. Stomach/Bowel:  No obstruction. Appendectomy. Vascular/Lymphatic: No acute vascular abnormality. Clips and deep to the left groin which may be related to history of orchiectomy. Reproductive:No pathologic findings. Other: No ascites or pneumoperitoneum. Musculoskeletal: No acute  abnormalities. IMPRESSION: 1. Left hydronephrosis from a 4 mm UVJ calculus. 2. Right nephrolithiasis. 3. Hepatic steatosis. Electronically Signed   By: Jorje Guild M.D.   On: 06/19/2022 04:23     PROCEDURES:  Critical Care performed: No  .1-3 Lead EKG Interpretation  Performed by: Paulette Blanch, MD Authorized by: Paulette Blanch, MD     Interpretation: normal     ECG rate:  60   ECG rate assessment: normal     Rhythm: sinus rhythm     Ectopy: none     Conduction: normal   Comments:     Patient placed  on cardiac monitor to evaluate for arrhythmias    MEDICATIONS ORDERED IN ED: Medications  ketorolac (TORADOL) 30 MG/ML injection 15 mg (has no administration in time range)  tamsulosin (FLOMAX) capsule 0.4 mg (has no administration in time range)  sodium chloride 0.9 % bolus 1,000 mL (1,000 mLs Intravenous New Bag/Given 06/19/22 0343)  ondansetron (ZOFRAN) injection 4 mg (4 mg Intravenous Given 06/19/22 0339)  HYDROmorphone (DILAUDID) injection 0.5 mg (0.5 mg Intravenous Given 06/19/22 0338)     IMPRESSION / MDM / ASSESSMENT AND PLAN / ED COURSE  I reviewed the triage vital signs and the nursing notes.                             57 year old male presenting with left flank pain. Differential diagnosis includes, but is not limited to, acute appendicitis, renal colic, testicular torsion, urinary tract infection/pyelonephritis, prostatitis,  epididymitis, diverticulitis, small bowel obstruction or ileus, colitis, abdominal aortic aneurysm, gastroenteritis, hernia, etc. personally viewed patient's records and note a PCP office visit from 04/17/2022 for right flank/thoracic back pain.  Patient's presentation is most consistent with acute presentation with potential threat to life or bodily function.  The patient is on the cardiac monitor to evaluate for evidence of arrhythmia and/or significant heart rate changes.  Will obtain basic lab work, UA, CT renal stone study.  Initiate IV fluid  hydration, IV Dilaudid for pain.  With IV Zofran for nausea.  Will reassess.  Clinical Course as of 06/19/22 0451  Fri Jun 19, 2022  0448 Updated patient on laboratory and imaging results; WBC 8.1, creatinine 1.29 elevated from prior, UA with moderate hemoglobin; CT with 4 mm left UVJ stone.  Patient pain-free at this time.  Will start Flomax, administer IV Ketorolac to keep pain from returning and discharge home with as needed Percocet/Zofran.  Strict return precautions given.  Patient verbalizes understanding and agrees with plan of care. [JS]    Clinical Course User Index [JS] Paulette Blanch, MD     FINAL CLINICAL IMPRESSION(S) / ED DIAGNOSES   Final diagnoses:  Left flank pain  Ureteral colic     Rx / DC Orders   ED Discharge Orders          Ordered    tamsulosin (FLOMAX) 0.4 MG CAPS capsule  Daily        06/19/22 0451    oxyCODONE-acetaminophen (PERCOCET/ROXICET) 5-325 MG tablet  Every 4 hours PRN        06/19/22 0451    ondansetron (ZOFRAN-ODT) 4 MG disintegrating tablet  Every 8 hours PRN        06/19/22 0451             Note:  This document was prepared using Dragon voice recognition software and may include unintentional dictation errors.   Paulette Blanch, MD 06/19/22 [phone]

## 2022-06-19 NOTE — Discharge Instructions (Signed)
1. Take pain & nausea medicines as needed (Percocet/Zofran #30). Make sure to take a stool softener while taking narcotic pain medicines. 2. Take Flomax 0.4mg daily x 14 days. 3. Drink plenty of bottled or filtered water daily. 4. Return to the ER for worsening symptoms, persistent vomiting, fever, difficulty breathing or other concerns.  

## 2022-07-09 ENCOUNTER — Encounter: Payer: Self-pay | Admitting: Urology

## 2022-07-09 ENCOUNTER — Ambulatory Visit: Payer: 59 | Admitting: Urology

## 2022-07-09 ENCOUNTER — Ambulatory Visit
Admission: RE | Admit: 2022-07-09 | Discharge: 2022-07-09 | Disposition: A | Discharge status: Home / Self Care | Payer: 59 | Source: Ambulatory Visit | Attending: Urology | Admitting: Urology

## 2022-07-09 VITALS — BP 122/84 | HR 73 | Ht 68.0 in | Wt 230.0 lb

## 2022-07-09 DIAGNOSIS — N200 Calculus of kidney: Secondary | ICD-10-CM

## 2022-07-09 DIAGNOSIS — E291 Testicular hypofunction: Secondary | ICD-10-CM | POA: Diagnosis not present

## 2022-07-09 DIAGNOSIS — N201 Calculus of ureter: Secondary | ICD-10-CM

## 2022-07-09 NOTE — Progress Notes (Signed)
I, Tyrone Madden,acting as a scribe for Tyrone Sons, MD.,have documented all relevant documentation on the behalf of Tyrone Sons, MD,as directed by  Tyrone Sons, MD while in the presence of Tyrone Sons, MD.   I, Tyrone Madden,acting as a scribe for Tyrone Sons, MD.,have documented all relevant documentation on the behalf of Tyrone Sons, MD,as directed by  Tyrone Sons, MD while in the presence of Tyrone Sons, MD.  07/09/2022 4:05 PM   Tyrone Madden 01-07-1966 [id number]  Referring provider: Paulette Blanch, MD [address] [place],  [place] [address]  Chief Complaint  Patient presents with   Hypogonadism    HPI: Tyrone Madden is a 57 y.o. male presents to reestablish care for renal colic.  ED visit 06/19/2022 for left flank pain. Stone protocol CT showed a 53mm left UVJ calculus and non-obstructing right renal calculus. He also has a history of hypogonadism and was previously seen by Tyrone Madden for TRT with last visit in 2018. Prior history of testicular cancer/orchiectomy. Several days after his ED visit his pain resolved and he is fairly certain he passed a stone. He has increased tiredness and fatigue.  His testosterone level performed on 06/23/2022 was 219. He had been on gels in the past which he states were not effective. However, Testopel worked well and he is interested in restarting.   PMH: Past Medical History:  Diagnosis Date   Appendicitis, acute    Blood glucose elevated 05/30/2015   BPH (benign prostatic hyperplasia)    Cancer    testicular cancer with surg and chemo tx   Decreased testosterone level 05/30/2015   Elevated alanine aminotransferase (ALT) level 03/26/2015   Elevated hemoglobin    Gout    History of testicular cancer    Hypogonadism in male    Over weight    Pure hypercholesterolemia 05/30/2015   Ruptured appendicitis 01/14/2017    Surgical History: Past Surgical History:  Procedure Laterality Date    CHOLECYSTECTOMY     COLONOSCOPY N/A 10/10/2021   Procedure: COLONOSCOPY;  Surgeon: Annamaria Helling, DO;  Location: [place];  Service: Gastroenterology;  Laterality: N/A;   GALLBLADDER SURGERY     LAPAROSCOPIC APPENDECTOMY N/A 01/13/2017   Procedure: APPENDECTOMY LAPAROSCOPIC;  Surgeon: Clayburn Pert, MD;  Location: [facility];  Service: General;  Laterality: N/A;   orchiectomy unilateral      Home Medications:  Allergies as of 07/09/2022   No Known Allergies      Medication List        Accurate as of July 09, 2022  4:05 PM. If you have any questions, ask your nurse or doctor.          loratadine 10 MG tablet Commonly known as: CLARITIN Take 10 mg by mouth daily.   meloxicam 15 MG tablet Commonly known as: MOBIC Take 1 tablet (15 mg total) by mouth daily.   ondansetron 4 MG disintegrating tablet Commonly known as: ZOFRAN-ODT Take 1 tablet (4 mg total) by mouth every 8 (eight) hours as needed for nausea or vomiting.   oxyCODONE-acetaminophen 5-325 MG tablet Commonly known as: PERCOCET/ROXICET Take 1 tablet by mouth every 4 (four) hours as needed for severe pain.   tamsulosin 0.4 MG Caps capsule Commonly known as: Flomax Take 1 capsule (0.4 mg total) by mouth daily.        Family History: Family History  Problem Relation Age of Onset   Heart disease Unknown  Prostate cancer Neg Hx    Bladder Cancer Neg Hx    Kidney disease Neg Hx    Kidney cancer Neg Hx     Social History:  reports that he has never smoked. He has never used smokeless tobacco. He reports current alcohol use. He reports that he does not use drugs.   Physical Exam: BP 122/84   Pulse 73   Ht 5\' 8"  (1.727 m)   Wt 230 lb (104.3 kg)   BMI 34.97 kg/m   Constitutional:  Alert and oriented, No acute distress. HEENT: [place] AT Respiratory: Normal respiratory effort, no increased work of breathing. GI: Abdomen is soft, nontender, nondistended, no abdominal masses Skin: No rashes,  bruises or suspicious lesions. Neurologic: Grossly intact, no focal deficits, moving all 4 extremities. Psychiatric: Normal mood and affect.   Pertinent Imaging: Stone protocol CT was personally reviewed and interpreted.   CT Renal Stone Study  Narrative CLINICAL DATA:  Abdominal/flank pain with stone suspected  EXAM: CT ABDOMEN AND PELVIS WITHOUT CONTRAST  TECHNIQUE: Multidetector CT imaging of the abdomen and pelvis was performed following the standard protocol without IV contrast.  RADIATION DOSE REDUCTION: This exam was performed according to the departmental dose-optimization program which includes automated exposure control, adjustment of the mA and/or kV according to patient size and/or use of iterative reconstruction technique.  COMPARISON:  01/12/2018  FINDINGS: Lower chest:  No contributory findings.  Hepatobiliary: Steatosis of the liver.Cholecystectomy. No bile duct dilatation  Pancreas: Unremarkable.  Spleen: Unremarkable.  Adrenals/Urinary Tract: Negative adrenals. Mild left hydroureteronephrosis with low-density left renal expansion and perinephric stranding. 4 mm stone at the left UVJ. 3 mm calculi in the upper and lower pole right kidney. Unremarkable bladder.  Stomach/Bowel:  No obstruction. Appendectomy.  Vascular/Lymphatic: No acute vascular abnormality. Clips and deep to the left groin which may be related to history of orchiectomy.  Reproductive:No pathologic findings.  Other: No ascites or pneumoperitoneum.  Musculoskeletal: No acute abnormalities.  IMPRESSION: 1. Left hydronephrosis from a 4 mm UVJ calculus. 2. Right nephrolithiasis. 3. Hepatic steatosis.   Electronically Signed By: Tiburcio Pea M.D. On: 06/19/2022 04:23   Assessment & Plan:    Nephrolithiasis Recent episode of renal colic with a left distal ureteral calculus which he most likely passed.  KUB ordered today and will call with results. He also has a  non-obstructing right renal calculus.  2. Hypogonadism He will have a second AM testosterone level drawn and will add an LH. If this is low he would like to restart Testopel.  I have reviewed the above documentation for accuracy and completeness, and I agree with the above.   Tyrone Altes, MD  [facility] [address] [place], [place] [address] [phone]

## 2022-07-13 ENCOUNTER — Encounter: Payer: Self-pay | Admitting: *Deleted

## 2022-07-14 ENCOUNTER — Other Ambulatory Visit: Payer: Self-pay | Admitting: Urology

## 2022-07-14 ENCOUNTER — Encounter: Payer: Self-pay | Admitting: Urology

## 2022-07-15 LAB — TESTOSTERONE: Testosterone: 376 ng/dL (ref 264–916)

## 2022-07-15 LAB — LUTEINIZING HORMONE: LH: 7.9 m[IU]/mL (ref 1.7–8.6)

## 2022-07-17 ENCOUNTER — Encounter: Payer: Self-pay | Admitting: *Deleted
